# Patient Record
Sex: Female | Born: 1965 | Race: White | Hispanic: No | Marital: Married | Smoking: Never smoker
Health system: Southern US, Community
[De-identification: ages and names within clinical notes are randomized; demographics above are authoritative.]

## PROBLEM LIST (undated history)

## (undated) DIAGNOSIS — I10 Essential (primary) hypertension: Secondary | ICD-10-CM

## (undated) DIAGNOSIS — K219 Gastro-esophageal reflux disease without esophagitis: Secondary | ICD-10-CM

## (undated) DIAGNOSIS — F419 Anxiety disorder, unspecified: Secondary | ICD-10-CM

## (undated) DIAGNOSIS — M6282 Rhabdomyolysis: Secondary | ICD-10-CM

## (undated) DIAGNOSIS — Z87442 Personal history of urinary calculi: Secondary | ICD-10-CM

## (undated) DIAGNOSIS — N189 Chronic kidney disease, unspecified: Secondary | ICD-10-CM

## (undated) DIAGNOSIS — Z8489 Family history of other specified conditions: Secondary | ICD-10-CM

## (undated) DIAGNOSIS — C50919 Malignant neoplasm of unspecified site of unspecified female breast: Secondary | ICD-10-CM

## (undated) DIAGNOSIS — N390 Urinary tract infection, site not specified: Secondary | ICD-10-CM

## (undated) DIAGNOSIS — Z923 Personal history of irradiation: Secondary | ICD-10-CM

## (undated) HISTORY — DX: Malignant neoplasm of unspecified site of unspecified female breast: C50.919

## (undated) HISTORY — PX: OTHER SURGICAL HISTORY: SHX169

## (undated) HISTORY — PX: CHOLECYSTECTOMY: SHX55

## (undated) HISTORY — PX: MS MAMMO SCREENING: HXRAD480

## (undated) HISTORY — PX: COLONOSCOPY: SHX174

---

## 2004-08-30 ENCOUNTER — Ambulatory Visit: Payer: Self-pay | Admitting: Obstetrics and Gynecology

## 2004-09-07 ENCOUNTER — Ambulatory Visit: Payer: Self-pay | Admitting: Obstetrics and Gynecology

## 2005-10-23 ENCOUNTER — Ambulatory Visit: Payer: Self-pay | Admitting: Obstetrics and Gynecology

## 2006-10-25 ENCOUNTER — Ambulatory Visit: Payer: Self-pay | Admitting: Obstetrics and Gynecology

## 2007-07-14 ENCOUNTER — Ambulatory Visit: Payer: Self-pay | Admitting: Emergency Medicine

## 2007-10-10 DIAGNOSIS — N189 Chronic kidney disease, unspecified: Secondary | ICD-10-CM

## 2007-10-10 DIAGNOSIS — M6282 Rhabdomyolysis: Secondary | ICD-10-CM

## 2007-10-10 HISTORY — DX: Rhabdomyolysis: M62.82

## 2007-10-10 HISTORY — DX: Chronic kidney disease, unspecified: N18.9

## 2007-11-28 ENCOUNTER — Ambulatory Visit: Payer: Self-pay | Admitting: Obstetrics and Gynecology

## 2008-02-24 ENCOUNTER — Ambulatory Visit: Payer: Self-pay | Admitting: Family Medicine

## 2008-02-24 ENCOUNTER — Other Ambulatory Visit: Payer: Self-pay

## 2008-03-13 ENCOUNTER — Ambulatory Visit: Payer: Self-pay | Admitting: Gastroenterology

## 2008-04-21 ENCOUNTER — Ambulatory Visit: Payer: Self-pay | Admitting: Gastroenterology

## 2008-04-29 ENCOUNTER — Ambulatory Visit: Payer: Self-pay | Admitting: Gastroenterology

## 2008-12-10 ENCOUNTER — Ambulatory Visit: Payer: Self-pay | Admitting: Obstetrics and Gynecology

## 2008-12-22 ENCOUNTER — Inpatient Hospital Stay: Payer: Self-pay | Admitting: Internal Medicine

## 2008-12-22 ENCOUNTER — Ambulatory Visit: Payer: Self-pay | Admitting: Internal Medicine

## 2010-01-10 ENCOUNTER — Ambulatory Visit: Payer: Self-pay | Admitting: Obstetrics and Gynecology

## 2010-02-23 ENCOUNTER — Ambulatory Visit: Payer: Self-pay | Admitting: Family Medicine

## 2010-02-24 ENCOUNTER — Ambulatory Visit: Payer: Self-pay | Admitting: Surgery

## 2010-02-25 ENCOUNTER — Ambulatory Visit: Payer: Self-pay

## 2010-02-28 ENCOUNTER — Ambulatory Visit: Payer: Self-pay | Admitting: Surgery

## 2011-01-19 ENCOUNTER — Ambulatory Visit: Payer: Self-pay | Admitting: Obstetrics and Gynecology

## 2011-04-13 ENCOUNTER — Emergency Department: Payer: Self-pay | Admitting: Emergency Medicine

## 2011-05-02 ENCOUNTER — Ambulatory Visit: Payer: Self-pay | Admitting: Sports Medicine

## 2012-01-23 ENCOUNTER — Ambulatory Visit: Payer: Self-pay | Admitting: Obstetrics and Gynecology

## 2013-01-23 ENCOUNTER — Ambulatory Visit: Payer: Self-pay | Admitting: Obstetrics and Gynecology

## 2014-01-14 ENCOUNTER — Ambulatory Visit: Payer: Self-pay

## 2014-01-27 ENCOUNTER — Ambulatory Visit: Payer: Self-pay | Admitting: Obstetrics and Gynecology

## 2014-03-06 ENCOUNTER — Ambulatory Visit: Payer: Self-pay | Admitting: Gastroenterology

## 2014-08-12 DIAGNOSIS — F32A Depression, unspecified: Secondary | ICD-10-CM | POA: Insufficient documentation

## 2014-08-12 DIAGNOSIS — I10 Essential (primary) hypertension: Secondary | ICD-10-CM | POA: Insufficient documentation

## 2014-08-12 DIAGNOSIS — F419 Anxiety disorder, unspecified: Secondary | ICD-10-CM

## 2014-08-12 DIAGNOSIS — F329 Major depressive disorder, single episode, unspecified: Secondary | ICD-10-CM | POA: Insufficient documentation

## 2014-09-06 ENCOUNTER — Emergency Department: Payer: Self-pay | Admitting: Student

## 2015-03-31 ENCOUNTER — Ambulatory Visit (INDEPENDENT_AMBULATORY_CARE_PROVIDER_SITE_OTHER)
Admission: EM | Admit: 2015-03-31 | Discharge: 2015-03-31 | Disposition: A | Discharge status: Other Acute Inpatient Hospital | Payer: BLUE CROSS/BLUE SHIELD | Source: Home / Self Care | Attending: Family Medicine | Admitting: Family Medicine

## 2015-03-31 ENCOUNTER — Encounter: Payer: Self-pay | Admitting: Emergency Medicine

## 2015-03-31 ENCOUNTER — Emergency Department
Admission: EM | Admit: 2015-03-31 | Discharge: 2015-03-31 | Disposition: A | Discharge status: Home / Self Care | Payer: BLUE CROSS/BLUE SHIELD | Attending: Emergency Medicine | Admitting: Emergency Medicine

## 2015-03-31 DIAGNOSIS — Z79899 Other long term (current) drug therapy: Secondary | ICD-10-CM | POA: Insufficient documentation

## 2015-03-31 DIAGNOSIS — I10 Essential (primary) hypertension: Secondary | ICD-10-CM | POA: Diagnosis not present

## 2015-03-31 DIAGNOSIS — R350 Frequency of micturition: Secondary | ICD-10-CM | POA: Diagnosis present

## 2015-03-31 DIAGNOSIS — N10 Acute tubulo-interstitial nephritis: Secondary | ICD-10-CM | POA: Diagnosis not present

## 2015-03-31 DIAGNOSIS — N12 Tubulo-interstitial nephritis, not specified as acute or chronic: Secondary | ICD-10-CM

## 2015-03-31 HISTORY — DX: Urinary tract infection, site not specified: N39.0

## 2015-03-31 HISTORY — DX: Essential (primary) hypertension: I10

## 2015-03-31 HISTORY — DX: Rhabdomyolysis: M62.82

## 2015-03-31 LAB — CBC WITH DIFFERENTIAL/PLATELET
BASOS PCT: 0 %
Basophils Absolute: 0.1 10*3/uL (ref 0–0.1)
Eosinophils Absolute: 0 10*3/uL (ref 0–0.7)
Eosinophils Relative: 0 %
HEMATOCRIT: 38 % (ref 35.0–47.0)
HEMOGLOBIN: 12.7 g/dL (ref 12.0–16.0)
Lymphocytes Relative: 4 %
Lymphs Abs: 0.9 10*3/uL — ABNORMAL LOW (ref 1.0–3.6)
MCH: 29.2 pg (ref 26.0–34.0)
MCHC: 33.5 g/dL (ref 32.0–36.0)
MCV: 87.3 fL (ref 80.0–100.0)
MONO ABS: 1.5 10*3/uL — AB (ref 0.2–0.9)
Monocytes Relative: 7 %
Neutro Abs: 18 10*3/uL — ABNORMAL HIGH (ref 1.4–6.5)
Neutrophils Relative %: 89 %
Platelets: 203 10*3/uL (ref 150–440)
RBC: 4.36 MIL/uL (ref 3.80–5.20)
RDW: 12.9 % (ref 11.5–14.5)
WBC: 20.5 10*3/uL — ABNORMAL HIGH (ref 3.6–11.0)

## 2015-03-31 LAB — URINALYSIS COMPLETE WITH MICROSCOPIC (ARMC ONLY)
BACTERIA UA: NONE SEEN
BILIRUBIN URINE: NEGATIVE
Bilirubin Urine: NEGATIVE
Glucose, UA: 250 mg/dL — AB
KETONES UR: NEGATIVE mg/dL
Ketones, ur: NEGATIVE mg/dL
NITRITE: NEGATIVE
Nitrite: NEGATIVE
PH: 6 (ref 5.0–8.0)
Protein, ur: 100 mg/dL — AB
Protein, ur: 30 mg/dL — AB
Specific Gravity, Urine: 1.015 (ref 1.005–1.030)
Specific Gravity, Urine: 1.007 (ref 1.005–1.030)
pH: 6.5 (ref 5.0–8.0)

## 2015-03-31 LAB — POCT PREGNANCY, URINE: PREG TEST UR: NEGATIVE

## 2015-03-31 LAB — COMPREHENSIVE METABOLIC PANEL
ALT: 22 U/L (ref 14–54)
ANION GAP: 13 (ref 5–15)
AST: 29 U/L (ref 15–41)
Albumin: 4.1 g/dL (ref 3.5–5.0)
Alkaline Phosphatase: 93 U/L (ref 38–126)
BUN: 7 mg/dL (ref 6–20)
CO2: 22 mmol/L (ref 22–32)
CREATININE: 0.85 mg/dL (ref 0.44–1.00)
Calcium: 9.5 mg/dL (ref 8.9–10.3)
Chloride: 100 mmol/L — ABNORMAL LOW (ref 101–111)
GFR calc non Af Amer: >60 mL/min (ref >60)
Glucose, Bld: 244 mg/dL — ABNORMAL HIGH (ref 65–99)
POTASSIUM: 3 mmol/L — AB (ref 3.5–5.1)
SODIUM: 135 mmol/L (ref 135–145)
TOTAL PROTEIN: 8 g/dL (ref 6.5–8.1)
Total Bilirubin: 0.5 mg/dL (ref 0.3–1.2)

## 2015-03-31 MED ORDER — CEPHALEXIN 500 MG PO CAPS
ORAL_CAPSULE | ORAL | Status: AC
  Administered 2015-03-31: 500 mg via ORAL
  Filled 2015-03-31: qty 1

## 2015-03-31 MED ORDER — ONDANSETRON HCL 4 MG PO TABS: 4.0000 mg | ORAL_TABLET | Freq: Four times a day (QID) | ORAL | Status: DC | PRN

## 2015-03-31 MED ORDER — CIPROFLOXACIN HCL 500 MG PO TABS: 500.0000 mg | ORAL_TABLET | Freq: Two times a day (BID) | ORAL | Status: DC

## 2015-03-31 MED ORDER — OXYCODONE HCL 5 MG PO TABS: 5.0000 mg | ORAL_TABLET | Freq: Four times a day (QID) | ORAL | Status: DC | PRN

## 2015-03-31 MED ORDER — CIPROFLOXACIN IN D5W 400 MG/200ML IV SOLN
400.0000 mg | Freq: Once | INTRAVENOUS | Status: AC
  Administered 2015-03-31: 400 mg via INTRAVENOUS

## 2015-03-31 MED ORDER — ACETAMINOPHEN 500 MG PO TABS
1000.0000 mg | ORAL_TABLET | Freq: Once | ORAL | Status: AC
  Administered 2015-03-31: 1000 mg via ORAL

## 2015-03-31 MED ORDER — SODIUM CHLORIDE 0.9 % IV BOLUS (SEPSIS)
1000.0000 mL | Freq: Once | INTRAVENOUS | Status: AC
  Administered 2015-03-31: 1000 mL via INTRAVENOUS

## 2015-03-31 MED ORDER — SODIUM CHLORIDE 0.9 % IV SOLN
INTRAVENOUS | Status: DC
  Administered 2015-03-31: 16:00:00 via INTRAVENOUS

## 2015-03-31 MED ORDER — ACETAMINOPHEN 325 MG PO TABS: 650.0000 mg | ORAL_TABLET | Freq: Four times a day (QID) | ORAL | Status: DC | PRN

## 2015-03-31 MED ORDER — CEPHALEXIN 500 MG PO CAPS: 500.0000 mg | ORAL_CAPSULE | Freq: Three times a day (TID) | ORAL | Status: DC

## 2015-03-31 MED ORDER — CEPHALEXIN 500 MG PO CAPS
500.0000 mg | ORAL_CAPSULE | Freq: Once | ORAL | Status: AC
  Administered 2015-03-31: 500 mg via ORAL

## 2015-03-31 NOTE — Discharge Instructions (Signed)
Going to ER for further evaluation and treatment.

## 2015-03-31 NOTE — ED Notes (Signed)
Per Dr. Keith Rake, patient to be transferred to [facility] ER. EMS has arrived and report given to them.

## 2015-03-31 NOTE — ED Provider Notes (Addendum)
CSN: [id number]     Arrival date & time 03/31/15  1516 History   First MD Initiated Contact with Patient 03/31/15 1606     Chief Complaint  Patient presents with  . Cystitis   (Consider location/radiation/quality/duration/timing/severity/associated sxs/prior Treatment) HPI Patient presents today with history of dysuria urinary frequency since Sunday. Patient also has some right sided back pain. She started with a fever last night. And has vomited 1. She does have a history of urinary tract infections. She is mentating normally. She does have increased respiratory rate and is tachycardic in the office today. She denies any chest pain.  Past Medical History  Diagnosis Date  . Rhabdomyolysis     20 09  . Hypertension   . UTI (urinary tract infection)    Past Surgical History  Procedure Laterality Date  . Cholecystectomy     Family History  Problem Relation Age of Onset  . Cancer Mother    History  Substance Use Topics  . Smoking status: Never Smoker   . Smokeless tobacco: Not on file  . Alcohol Use: Yes     Comment: socially   OB History    No data available     Review of Systems negative except mentioned above  Allergies  Motrin and Phenergan  Home Medications   Prior to Admission medications   Medication Sig Start Date End Date Taking? Authorizing Provider  acetaminophen (TYLENOL) 500 MG tablet Take 1,000 mg by mouth every 6 (six) hours as needed.   Yes Historical Provider, MD  amLODipine (NORVASC) 10 MG tablet Take 10 mg by mouth daily.   Yes Historical Provider, MD  norethindrone-ethinyl estradiol (NECON,BREVICON,MODICON) 0.5-35 MG-MCG tablet Take 1 tablet by mouth daily.   Yes Historical Provider, MD  RABEprazole (ACIPHEX) 20 MG tablet Take 20 mg by mouth daily.   Yes Historical Provider, MD   BP 140/84 mmHg  Pulse 144  Temp(Src) 102.4 F (39.1 C) (Tympanic)  Resp 38  Ht 5\' 6"  (1.676 m)  Wt 170 lb (77.111 kg)  BMI 27.45 kg/m2  SpO2 99%  LMP 03/17/2015  (Approximate) Physical Exam GEN.-No apparent distress RESP-tachypnea, no wheezes or rhonci heard  CARDIAC-tachycardic ABD- +BS, mild suprapubic tenderness, mild right flank tenderness, no rebound or guarding  NEUROLOGIC-CN II-XII grossly intact, alert and awake  ED Course  Procedures (including critical care time) Labs Review Labs Reviewed  URINALYSIS COMPLETEWITH MICROSCOPIC ([place] ONLY) - Abnormal; Notable for the following:    APPearance HAZY (*)    Glucose, UA 250 (*)    Hgb urine dipstick TRACE (*)    Protein, ur 100 (*)    Leukocytes, UA 1+ (*)    Bacteria, UA FEW (*)    Squamous Epithelial / LPF 6-30 (*)    All other components within normal limits  URINE CULTURE  COMPREHENSIVE METABOLIC PANEL  CBC WITH DIFFERENTIAL/PLATELET    Imaging Review No results found.   MDM  Pyelonephritis-given patient's symptoms and vitals would recommend patient go to the ER for further evaluation and treatment. Asked nurse to place IV and and start fluids with Cipro IV, EMS to take patient to the ER now. Further blood work and/or imaging will be done at the ER. Tylenol 1 g PO was given to the patient I the nurse.    Paulina Fusi, MD 03/31/15 1617  Paulina Fusi, MD 03/31/15 [phone]

## 2015-03-31 NOTE — ED Notes (Signed)
AAOx3.  Skin warm and dry.  NAD.  Discharge home.  Patient ambulates with easy and steady gait.

## 2015-03-31 NOTE — ED Notes (Signed)
Patient placed up for discharge.  Per Dr. Joni Fears, patient to finish receiving liter of NS started PTA and the ordered 1L NS before discharge.  Discharge held until IVF infused.

## 2015-03-31 NOTE — ED Provider Notes (Signed)
[facility] Emergency Department Provider Note  ____________________________________________  Time seen: 5:30 PM  I have reviewed the triage vital signs and the nursing notes.   HISTORY  Chief Complaint Urinary Tract Infection    HPI Dana Harrell is a 49 y.o. female who reports urinary frequency and urgency 3 days. She drank lots of water to try and improve her symptoms that she does have history of UTI, but last night she was feeling very ill with nausea and severe fatigue. She went to urgent care today where she was found to have likely pyelonephritis and sent to the ED for further evaluation. The patient denies chest pain shortness of breath fevers chills headache syncope abdominal pain. No hematuria. She is tolerating oral intake.She does report some back pain     Past Medical History  Diagnosis Date  . Rhabdomyolysis     2009  . Hypertension   . UTI (urinary tract infection)     There are no active problems to display for this patient.   Past Surgical History  Procedure Laterality Date  . Cholecystectomy      Current Outpatient Rx  Name  Route  Sig  Dispense  Refill  . acetaminophen (TYLENOL) 325 MG tablet   Oral   Take 2 tablets (650 mg total) by mouth every 6 (six) hours as needed.   60 tablet   0   . acetaminophen (TYLENOL) 500 MG tablet   Oral   Take 1,000 mg by mouth every 6 (six) hours as needed.         Marland Kitchen amLODipine (NORVASC) 10 MG tablet   Oral   Take 10 mg by mouth daily.         . cephALEXin (KEFLEX) 500 MG capsule   Oral   Take 1 capsule (500 mg total) by mouth 3 (three) times daily.   21 capsule   0   . ciprofloxacin (CIPRO) 500 MG tablet   Oral   Take 1 tablet (500 mg total) by mouth 2 (two) times daily.   14 tablet   0   . norethindrone-ethinyl estradiol (NECON,BREVICON,MODICON) 0.5-35 MG-MCG tablet   Oral   Take 1 tablet by mouth daily.         . ondansetron (ZOFRAN) 4 MG tablet   Oral   Take 1  tablet (4 mg total) by mouth every 6 (six) hours as needed for nausea or vomiting.   20 tablet   1   . oxyCODONE (ROXICODONE) 5 MG immediate release tablet   Oral   Take 1 tablet (5 mg total) by mouth every 6 (six) hours as needed for breakthrough pain.   12 tablet   0   . RABEprazole (ACIPHEX) 20 MG tablet   Oral   Take 20 mg by mouth daily.           Allergies Motrin and Phenergan  Family History  Problem Relation Age of Onset  . Cancer Mother     Social History History  Substance Use Topics  . Smoking status: Never Smoker   . Smokeless tobacco: Not on file  . Alcohol Use: Yes     Comment: socially    Review of Systems  Constitutional: No fever or chills. No weight changes Eyes:No blurry vision or double vision.  ENT: No sore throat. Cardiovascular: No chest pain. Respiratory: No dyspnea or cough. Gastrointestinal: Negative for abdominal pain, vomiting and diarrhea.  No BRBPR or melena. Genitourinary: As above . Musculoskeletal: Positive for  back pain. No joint swelling or pain. Skin: Negative for rash. Neurological: Negative for headaches, focal weakness or numbness. Psychiatric:No anxiety or depression.   Endocrine:No hot/cold intolerance, changes in energy, or sleep difficulty.  10-point ROS otherwise negative.  ____________________________________________   PHYSICAL EXAM:  VITAL SIGNS: ED Triage Vitals  Enc Vitals Group     BP 03/31/15 1736 149/76 mmHg     Pulse Rate 03/31/15 1736 104     Resp 03/31/15 1736 16     Temp 03/31/15 1736 99.5 F (37.5 C)     Temp Source 03/31/15 1736 Oral     SpO2 03/31/15 1731 96 %     Weight 03/31/15 1736 170 lb (77.111 kg)     Height 03/31/15 1736 5\' 6"  (1.676 m)     Head Cir --      Peak Flow --      Pain Score 03/31/15 1737 2     Pain Loc --      Pain Edu? --      Excl. in [place]? --      Constitutional: Alert and oriented. Well appearing and in no distress. Eyes: No scleral icterus. No conjunctival  pallor. PERRL. EOMI ENT   Head: Normocephalic and atraumatic.   Nose: No congestion/rhinnorhea. No septal hematoma   Mouth/Throat: MMM, no pharyngeal erythema. No peritonsillar mass. No uvula shift.   Neck: No stridor. No SubQ emphysema. No meningismus. Hematological/Lymphatic/Immunilogical: No cervical lymphadenopathy. Cardiovascular: Tachycardic heart rate 105. Normal and symmetric distal pulses are present in all extremities. No murmurs, rubs, or gallops. Respiratory: Normal respiratory effort without tachypnea nor retractions. Breath sounds are clear and equal bilaterally. No wheezes/rales/rhonchi. Gastrointestinal: Suprapubic tenderness. No distention. There is bilateral CVA tenderness.  No rebound, rigidity, or guarding. Genitourinary: deferred Musculoskeletal: Nontender with normal range of motion in all extremities. No joint effusions.  No lower extremity tenderness.  No edema. Neurologic:   Normal speech and language.  CN 2-10 normal. Motor grossly intact. No pronator drift.  Normal gait. No gross focal neurologic deficits are appreciated.  Skin:  Skin is warm, dry and intact. No rash noted.  No petechiae, purpura, or bullae. Psychiatric: Mood and affect are normal. Speech and behavior are normal. Patient exhibits appropriate insight and judgment.  ____________________________________________    LABS (pertinent positives/negatives) (all labs ordered are listed, but only abnormal results are displayed) Labs Reviewed  URINALYSIS COMPLETEWITH MICROSCOPIC ([place]) - Abnormal; Notable for the following:    Color, Urine YELLOW (*)    APPearance CLEAR (*)    Glucose, UA >500 (*)    Hgb urine dipstick 1+ (*)    Protein, ur 30 (*)    Leukocytes, UA 1+ (*)    Squamous Epithelial / LPF 0-5 (*)    All other components within normal limits  URINE CULTURE  POC URINE PREG, ED  POCT PREGNANCY, URINE   urine white blood cell too numerous to count White blood cell count  20 with normal creatinine ____________________________________________   EKG    ____________________________________________    RADIOLOGY    ____________________________________________   PROCEDURES  ____________________________________________   INITIAL IMPRESSION / ASSESSMENT AND PLAN / ED COURSE  Pertinent labs & imaging results that were available during my care of the patient were reviewed by me and considered in my medical decision making (see chart for details).  Patient presents with pyelonephritis. She is febrile and tachycardic. However, fever has resolved with Tylenol, and heart rate has improved from 140-101 L IV fluids. The patient  feels much better and is relatively young and healthy and I think suitable for outpatient management. The patient agrees with this and preferred to go home. She is tolerating oral intake and has no severe symptoms at this time. I sent a urine culture to firm efficacy of intermittent microbial management. She was given IV Cipro at urgent care prior to transfer, salt continue her on Cipro but also add Keflex to ensure that her infection is treated due to possible resistance to Cipro. Patient is responsive to fluids and should be able to keep her self hydrated. I'll give her pain medicine and nausea medicine in addition to antibiotics and have her follow up with primary care in 1 week. Patient is not having any symptoms or findings to suggest that she has a complicating kidney stone in the setting of this infection.  ____________________________________________   FINAL CLINICAL IMPRESSION(S) / ED DIAGNOSES  Final diagnoses:  Acute pyelonephritis      Carrie Mew, MD 03/31/15 [phone]

## 2015-03-31 NOTE — ED Notes (Signed)
Pain with voiding and urinary frequency started Sunday. C/o low back pain. Fever started during this last night. Started with nausea this morning and vomited x 1 around 12:30hrs.

## 2015-03-31 NOTE — Discharge Instructions (Signed)
Pyelonephritis, Adult Pyelonephritis is a kidney infection. In general, there are 2 main types of pyelonephritis:  Infections that come on quickly without any warning (acute pyelonephritis).  Infections that persist for a long period of time (chronic pyelonephritis). CAUSES  Two main causes of pyelonephritis are:  Bacteria traveling from the bladder to the kidney. This is a problem especially in pregnant women. The urine in the bladder can become filled with bacteria from multiple causes, including:  Inflammation of the prostate gland (prostatitis).  Sexual intercourse in females.  Bladder infection (cystitis).  Bacteria traveling from the bloodstream to the tissue part of the kidney. Problems that may increase your risk of getting a kidney infection include:  Diabetes.  Kidney stones or bladder stones.  Cancer.  Catheters placed in the bladder.  Other abnormalities of the kidney or ureter. SYMPTOMS   Abdominal pain.  Pain in the side or flank area.  Fever.  Chills.  Upset stomach.  Blood in the urine (dark urine).  Frequent urination.  Strong or persistent urge to urinate.  Burning or stinging when urinating. DIAGNOSIS  Your caregiver may diagnose your kidney infection based on your symptoms. A urine sample may also be taken. TREATMENT  In general, treatment depends on how severe the infection is.   If the infection is mild and caught early, your caregiver may treat you with oral antibiotics and send you home.  If the infection is more severe, the bacteria may have gotten into the bloodstream. This will require intravenous (IV) antibiotics and a hospital stay. Symptoms may include:  High fever.  Severe flank pain.  Shaking chills.  Even after a hospital stay, your caregiver may require you to be on oral antibiotics for a period of time.  Other treatments may be required depending upon the cause of the infection. HOME CARE INSTRUCTIONS   Take your  antibiotics as directed. Finish them even if you start to feel better.  Make an appointment to have your urine checked to make sure the infection is gone.  Drink enough fluids to keep your urine clear or pale yellow.  Take medicines for the bladder if you have urgency and frequency of urination as directed by your caregiver. SEEK IMMEDIATE MEDICAL CARE IF:   You have a fever or persistent symptoms for more than 2-3 days.  You have a fever and your symptoms suddenly get worse.  You are unable to take your antibiotics or fluids.  You develop shaking chills.  You experience extreme weakness or fainting.  There is no improvement after 2 days of treatment. MAKE SURE YOU:  Understand these instructions.  Will watch your condition.  Will get help right away if you are not doing well or get worse. Document Released: 09/25/2005 Document Revised: 03/26/2012 Document Reviewed: 03/01/2011 [facility] Patient Information 2015 [place], [place]. This information is not intended to replace advice given to you by your health care provider. Make sure you discuss any questions you have with your health care provider.   Take Cipro and Keflex as prescribed to ensure that your kidney infection is adequately treated. Take oxycodone as needed for pain and continue taking Tylenol to control fever.

## 2015-03-31 NOTE — ED Notes (Signed)
Patient seen at urgent care today for UTI symptoms.  States began having urinary frequency and urgency Sunday.  Started drinking water, which usually improves symptoms.  Last night began feeling "very sick".  1L NS started at Urgent Care and Cipro 400 mg IV.  1 gm tylenol given PTA for temp of 102.

## 2015-04-01 ENCOUNTER — Encounter: Payer: Self-pay | Admitting: *Deleted

## 2015-04-01 DIAGNOSIS — I10 Essential (primary) hypertension: Secondary | ICD-10-CM | POA: Diagnosis not present

## 2015-04-01 DIAGNOSIS — N390 Urinary tract infection, site not specified: Secondary | ICD-10-CM | POA: Diagnosis not present

## 2015-04-01 LAB — URINE CULTURE
Culture: NO GROWTH
Special Requests: NORMAL

## 2015-04-01 NOTE — ED Notes (Signed)
Pt was seen here yesterday for UTI and she is not any better

## 2015-04-01 NOTE — ED Notes (Signed)
Patient seen here yesterday for Urinary tract infection and "isn't any better." PCP unable to see patient today.

## 2015-04-02 ENCOUNTER — Emergency Department
Admission: EM | Admit: 2015-04-02 | Discharge: 2015-04-02 | Discharge status: Against Medical Advice | Payer: BLUE CROSS/BLUE SHIELD | Attending: Emergency Medicine | Admitting: Emergency Medicine

## 2015-04-02 LAB — URINE CULTURE

## 2016-10-09 DIAGNOSIS — C50919 Malignant neoplasm of unspecified site of unspecified female breast: Secondary | ICD-10-CM

## 2016-10-09 DIAGNOSIS — Z923 Personal history of irradiation: Secondary | ICD-10-CM

## 2016-10-09 HISTORY — PX: BREAST LUMPECTOMY: SHX2

## 2016-10-09 HISTORY — PX: BREAST BIOPSY: SHX20

## 2016-10-09 HISTORY — DX: Malignant neoplasm of unspecified site of unspecified female breast: C50.919

## 2016-10-09 HISTORY — DX: Personal history of irradiation: Z92.3

## 2017-01-25 ENCOUNTER — Other Ambulatory Visit: Payer: Self-pay | Admitting: Obstetrics and Gynecology

## 2017-01-25 DIAGNOSIS — N6320 Unspecified lump in the left breast, unspecified quadrant: Secondary | ICD-10-CM

## 2017-02-02 ENCOUNTER — Ambulatory Visit
Admission: RE | Admit: 2017-02-02 | Discharge: 2017-02-02 | Disposition: A | Discharge status: Home / Self Care | Payer: BLUE CROSS/BLUE SHIELD | Source: Ambulatory Visit | Attending: Obstetrics and Gynecology | Admitting: Obstetrics and Gynecology

## 2017-02-02 ENCOUNTER — Encounter: Payer: Self-pay | Admitting: Radiology

## 2017-02-02 DIAGNOSIS — N6320 Unspecified lump in the left breast, unspecified quadrant: Secondary | ICD-10-CM

## 2017-02-05 ENCOUNTER — Other Ambulatory Visit: Payer: Self-pay | Admitting: Obstetrics and Gynecology

## 2017-02-05 DIAGNOSIS — R928 Other abnormal and inconclusive findings on diagnostic imaging of breast: Secondary | ICD-10-CM

## 2017-02-05 DIAGNOSIS — N6320 Unspecified lump in the left breast, unspecified quadrant: Secondary | ICD-10-CM

## 2017-02-06 ENCOUNTER — Ambulatory Visit
Admission: RE | Admit: 2017-02-06 | Discharge: 2017-02-06 | Disposition: A | Discharge status: Home / Self Care | Payer: BLUE CROSS/BLUE SHIELD | Source: Ambulatory Visit | Attending: Obstetrics and Gynecology | Admitting: Obstetrics and Gynecology

## 2017-02-06 DIAGNOSIS — C50412 Malignant neoplasm of upper-outer quadrant of left female breast: Secondary | ICD-10-CM | POA: Diagnosis not present

## 2017-02-06 DIAGNOSIS — R928 Other abnormal and inconclusive findings on diagnostic imaging of breast: Secondary | ICD-10-CM

## 2017-02-06 DIAGNOSIS — N6320 Unspecified lump in the left breast, unspecified quadrant: Secondary | ICD-10-CM | POA: Diagnosis present

## 2017-02-08 LAB — SURGICAL PATHOLOGY

## 2017-02-15 ENCOUNTER — Other Ambulatory Visit (HOSPITAL_COMMUNITY): Payer: Self-pay | Admitting: Surgery

## 2017-02-15 DIAGNOSIS — C50812 Malignant neoplasm of overlapping sites of left female breast: Secondary | ICD-10-CM

## 2017-02-20 ENCOUNTER — Other Ambulatory Visit (HOSPITAL_COMMUNITY): Payer: Self-pay | Admitting: Surgery

## 2017-02-20 DIAGNOSIS — C50812 Malignant neoplasm of overlapping sites of left female breast: Secondary | ICD-10-CM

## 2017-02-26 DIAGNOSIS — C50912 Malignant neoplasm of unspecified site of left female breast: Secondary | ICD-10-CM | POA: Insufficient documentation

## 2017-02-27 ENCOUNTER — Encounter: Payer: Self-pay | Admitting: Oncology

## 2017-02-27 ENCOUNTER — Ambulatory Visit (HOSPITAL_COMMUNITY)
Admission: RE | Admit: 2017-02-27 | Discharge: 2017-02-27 | Disposition: A | Discharge status: Home / Self Care | Payer: BLUE CROSS/BLUE SHIELD | Source: Ambulatory Visit | Attending: Surgery | Admitting: Surgery

## 2017-02-27 ENCOUNTER — Inpatient Hospital Stay: Payer: BLUE CROSS/BLUE SHIELD | Attending: Oncology | Admitting: Oncology

## 2017-02-27 VITALS — BP 135/78 | HR 80 | Temp 97.9°F | Resp 16 | Ht 66.0 in | Wt 178.0 lb

## 2017-02-27 DIAGNOSIS — Z170 Estrogen receptor positive status [ER+]: Secondary | ICD-10-CM

## 2017-02-27 DIAGNOSIS — C50812 Malignant neoplasm of overlapping sites of left female breast: Secondary | ICD-10-CM | POA: Diagnosis present

## 2017-02-27 DIAGNOSIS — I10 Essential (primary) hypertension: Secondary | ICD-10-CM | POA: Diagnosis not present

## 2017-02-27 DIAGNOSIS — Z803 Family history of malignant neoplasm of breast: Secondary | ICD-10-CM | POA: Diagnosis not present

## 2017-02-27 DIAGNOSIS — Z8739 Personal history of other diseases of the musculoskeletal system and connective tissue: Secondary | ICD-10-CM | POA: Insufficient documentation

## 2017-02-27 DIAGNOSIS — Z79899 Other long term (current) drug therapy: Secondary | ICD-10-CM | POA: Diagnosis not present

## 2017-02-27 DIAGNOSIS — C50912 Malignant neoplasm of unspecified site of left female breast: Secondary | ICD-10-CM

## 2017-02-27 DIAGNOSIS — Z87440 Personal history of urinary (tract) infections: Secondary | ICD-10-CM | POA: Insufficient documentation

## 2017-02-27 DIAGNOSIS — C50412 Malignant neoplasm of upper-outer quadrant of left female breast: Secondary | ICD-10-CM | POA: Diagnosis not present

## 2017-02-27 DIAGNOSIS — Z9049 Acquired absence of other specified parts of digestive tract: Secondary | ICD-10-CM | POA: Diagnosis not present

## 2017-02-27 LAB — POCT I-STAT CREATININE: Creatinine, Ser: 0.9 mg/dL (ref 0.44–1.00)

## 2017-02-27 MED ORDER — GADOBENATE DIMEGLUMINE 529 MG/ML IV SOLN
20.0000 mL | Freq: Once | INTRAVENOUS | Status: AC | PRN
  Administered 2017-02-27: 17 mL via INTRAVENOUS

## 2017-02-27 NOTE — Progress Notes (Signed)
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 Tamala Julian  Date of visit: 02/27/17   History of presenting illness- 1. Patient is a 51 year old premenopausal female who noticed a palpable left breast mass in April 2018. Her prior mammogram was in 2015 which was normal. She has not had any personal history of breast cancer or prior breast biopsies. She does have a strong family history of breast cancer in her maternal aunts. 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 Recent mammogram on 02/02/2017 showed: There is a lobular mass within the upper-outer left breast underlying the palpable the marker. Mass measures 2.5 x 2.1 cm on mammography. No additional concerning masses, calcifications or nonsurgical distortion identified within either breast.  Mammographic images were processed with CAD.  On physical exam, I palpate a firm mass within the upper-outer left breast.  Targeted ultrasound is performed, showing a 2.0 x 1.8 x 1.6 cm irregular hypoechoic mass left breast 3 o'clock position 4 cm from nipple, corresponding with palpable abnormality. No left axillary adenopathy.  3. Patient had an ultrasound-guided core biopsy of the left breast mass showed: DIAGNOSIS:  A. BREAST, LEFT, 3 O'CLOCK 4 CM FROM NIPPLE; ULTRASOUND-GUIDED CORE  BIOPSY:  - INVASIVE MAMMARY CARCINOMA, NO SPECIAL TYPE.   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 Patient's last definite menstrual cycle was in March 2018. She has had some minor bleeding for a couple of days last month. She is G5 P5 L5. Did not use birth control and did not breast feed. Menarche at the age of 25. ECOG PS- 0  Pain scale- 0   Review of systems- Review of Systems  Constitutional: Negative for chills, fever, malaise/fatigue and weight loss.  HENT: Negative for congestion, ear discharge and nosebleeds.   Eyes: Negative for blurred vision.  Respiratory: Negative for cough, hemoptysis, sputum production, shortness of breath and wheezing.   Cardiovascular: Negative for chest pain, palpitations, orthopnea and claudication.  Gastrointestinal: Negative for abdominal pain, blood in stool, constipation, diarrhea, heartburn, melena, nausea and vomiting.  Genitourinary: Negative for dysuria, flank pain, frequency, hematuria and urgency.  Musculoskeletal: Negative for back pain, joint pain and myalgias.  Skin: Negative for rash.  Neurological: Negative for dizziness, tingling, focal weakness, seizures, weakness and headaches.  Endo/Heme/Allergies: Does not bruise/bleed easily.  Psychiatric/Behavioral: Negative for depression and suicidal ideas. The patient does not have insomnia.     Allergies  Allergen Reactions  . Lovastatin  Other (See Comments)    Chest pain  . Motrin [Ibuprofen] Other (See Comments)    Hx of renal failure  .  Phenergan [Promethazine Hcl] Other (See Comments)    Excessive sleep  . Promethazine Other (See Comments)    Excessive sleepiness.    There are no active problems to display for this patient.    Past Medical History:  Diagnosis Date  . Hypertension   . Rhabdomyolysis    2009  . UTI (urinary tract infection)      Past Surgical History:  Procedure Laterality Date  . CHOLECYSTECTOMY    . COLONOSCOPY     4/17  . MS MAMMO SCREENING     4/18  . OTHER SURGICAL HISTORY     pap 4/18    Social History   Social History  . Marital status: Married    Spouse name: N/A  . Number of children: N/A  . Years of education: N/A   Occupational History  . Not on file.   Social History Main Topics  . Smoking status: Never Smoker  . Smokeless tobacco: Never Used  . Alcohol use Yes     Comment: socially  . Drug use: No  . Sexual activity: Not on file   Other Topics Concern  . Not on file   Social History Narrative  . No narrative on file     Family History  Problem Relation Age of Onset  . Colon cancer Mother 43  . Breast cancer Maternal Aunt        3 materal aunts all 53's  . Breast cancer Maternal Aunt   . Breast cancer Maternal Aunt      Current Outpatient Prescriptions:  .  amLODipine (NORVASC) 10 MG tablet, Take 10 mg by mouth daily., Disp: , Rfl:  .  OVER THE COUNTER MEDICATION, Take 1,000 mg by mouth daily., Disp: , Rfl:  .  RABEprazole (ACIPHEX) 20 MG tablet, Take 20 mg by mouth daily., Disp: , Rfl:  .  sertraline (ZOLOFT) 50 MG tablet, Take 50 mg by mouth daily., Disp: , Rfl:  .  acetaminophen (TYLENOL) 325 MG tablet, Take 2 tablets (650 mg total) by mouth every 6 (six) hours as needed. (Patient not taking: Reported on 02/27/2017), Disp: 60 tablet, Rfl: 0 .  acetaminophen (TYLENOL) 500 MG tablet, Take 1,000 mg by mouth every 6 (six) hours as needed., Disp: , Rfl:  .  cephALEXin (KEFLEX) 500 MG capsule, Take 1 capsule (500 mg total) by mouth 3 (three) times  daily. (Patient not taking: Reported on 02/27/2017), Disp: 21 capsule, Rfl: 0 .  ciprofloxacin (CIPRO) 500 MG tablet, Take 1 tablet (500 mg total) by mouth 2 (two) times daily. (Patient not taking: Reported on 02/27/2017), Disp: 14 tablet, Rfl: 0 .  norethindrone-ethinyl estradiol (NECON,BREVICON,MODICON) 0.5-35 MG-MCG tablet, Take 1 tablet by mouth daily., Disp: , Rfl:  .  ondansetron (ZOFRAN) 4 MG tablet, Take 1 tablet (4 mg total) by mouth every 6 (six) hours as needed for nausea or vomiting. (Patient not taking: Reported on 02/27/2017), Disp: 20 tablet, Rfl: 1 .  oxyCODONE (ROXICODONE) 5 MG immediate release tablet, Take 1 tablet (5 mg total) by mouth every 6 (six) hours as needed for breakthrough pain. (Patient not taking: Reported on 02/27/2017), Disp: 12 tablet, Rfl: 0   Physical exam:  Vitals:   02/27/17 1408  BP: 135/78  Pulse: 80  Resp: 16  Temp: 97.9 F (36.6 C)  TempSrc: Tympanic  SpO2: 98%  Weight: 178  lb (80.7 kg)  Height: 5' 6"  (1.676 m)   Physical Exam  Constitutional: She is oriented to person, place, and time and well-developed, well-nourished, and in no distress.  HENT:  Head: Normocephalic and atraumatic.  Eyes: EOM are normal. Pupils are equal, round, and reactive to light.  Neck: Normal range of motion.  Cardiovascular: Normal rate, regular rhythm and normal heart sounds.   Pulmonary/Chest: Effort normal and breath sounds normal.  Abdominal: Soft. Bowel sounds are normal.  Neurological: She is alert and oriented to person, place, and time.  Skin: Skin is warm and dry.    palpable breast mass in the left upper outer quadrant about 2 cm in size. No other palpable masses in bilateral breasts and no palpable axillary adenopathy bilaterally     CMP Latest Ref Rng & Units 02/27/2017  Glucose 65 - 99 mg/dL -  BUN 6 - 20 mg/dL -  Creatinine 0.44 - 1.00 mg/dL 0.90  Sodium 135 - 145 mmol/L -  Potassium 3.5 - 5.1 mmol/L -  Chloride 101 - 111 mmol/L -  CO2 22 - 32  mmol/L -  Calcium 8.9 - 10.3 mg/dL -  Total Protein 6.5 - 8.1 g/dL -  Total Bilirubin 0.3 - 1.2 mg/dL -  Alkaline Phos 38 - 126 U/L -  AST 15 - 41 U/L -  ALT 14 - 54 U/L -   CBC Latest Ref Rng & Units 03/31/2015  WBC 3.6 - 11.0 K/uL 20.5(H)  Hemoglobin 12.0 - 16.0 g/dL 12.7  Hematocrit 35.0 - 47.0 % 38.0  Platelets 150 - 440 K/uL 203    No images are attached to the encounter.  US Breast Ltd Uni Left Inc Axilla  Result Date: 02/02/2017 CLINICAL DATA:  Patient presents for palpable abnormality within the upper-outer left breast. EXAM: DIGITAL DIAGNOSTIC BILATERAL MAMMOGRAM WITH CAD ULTRASOUND LEFT BREAST COMPARISON:  Previous exam(s). ACR Breast Density Category c: The breast tissue is heterogeneously dense, which may obscure small masses. FINDINGS: There is a lobular mass within the upper-outer left breast underlying the palpable the marker. Mass measures 2.5 x 2.1 cm on mammography. No additional concerning masses, calcifications or nonsurgical distortion identified within either breast. Mammographic images were processed with CAD. On physical exam, I palpate a firm mass within the upper-outer left breast. Targeted ultrasound is performed, showing a 2.0 x 1.8 x 1.6 cm irregular hypoechoic mass left breast 3 o'clock position 4 cm from nipple, corresponding with palpable abnormality. No left axillary adenopathy. IMPRESSION: Suspicious palpable left breast mass. RECOMMENDATION: Ultrasound-guided core needle biopsy left breast mass. If mass demonstrates malignant pathology, recommend bilateral breast MRI given density of the breast tissue and location of the mass. I have discussed the findings and recommendations with the patient. Results were also provided in writing at the conclusion of the visit. If applicable, a reminder letter will be sent to the patient regarding the next appointment. BI-RADS CATEGORY  4: Suspicious. Electronically Signed   By: Lovey Newcomer M.D.   On: 02/02/2017 11:51   Mm Diag  Breast Tomo Bilateral  Result Date: 02/02/2017 CLINICAL DATA:  Patient presents for palpable abnormality within the upper-outer left breast. EXAM: DIGITAL DIAGNOSTIC BILATERAL MAMMOGRAM WITH CAD ULTRASOUND LEFT BREAST COMPARISON:  Previous exam(s). ACR Breast Density Category c: The breast tissue is heterogeneously dense, which may obscure small masses. FINDINGS: There is a lobular mass within the upper-outer left breast underlying the palpable the marker. Mass measures 2.5 x 2.1 cm on mammography. No additional concerning masses, calcifications  or nonsurgical distortion identified within either breast. Mammographic images were processed with CAD. On physical exam, I palpate a firm mass within the upper-outer left breast. Targeted ultrasound is performed, showing a 2.0 x 1.8 x 1.6 cm irregular hypoechoic mass left breast 3 o'clock position 4 cm from nipple, corresponding with palpable abnormality. No left axillary adenopathy. IMPRESSION: Suspicious palpable left breast mass. RECOMMENDATION: Ultrasound-guided core needle biopsy left breast mass. If mass demonstrates malignant pathology, recommend bilateral breast MRI given density of the breast tissue and location of the mass. I have discussed the findings and recommendations with the patient. Results were also provided in writing at the conclusion of the visit. If applicable, a reminder letter will be sent to the patient regarding the next appointment. BI-RADS CATEGORY  4: Suspicious. Electronically Signed   By: Lovey Newcomer M.D.   On: 02/02/2017 11:51   Mm Clip Placement Left  Result Date: 02/06/2017 CLINICAL DATA:  Post biopsy mammogram of the left breast for clip placement. EXAM: DIAGNOSTIC LEFT MAMMOGRAM POST ULTRASOUND BIOPSY COMPARISON:  Previous exam(s). FINDINGS: Mammographic images were obtained following ultrasound guided biopsy of a left breast mass just slightly superior to 3 o'clock. The ribbon shaped biopsy marking clip is appropriately positioned  at the site of biopsied superior to the 3 o'clock axis. IMPRESSION: Appropriate positioning of the ribbon shaped biopsy marking clip just slightly superior to the 3 o'clock axis in the left breast. Final Assessment: Post Procedure Mammograms for Marker Placement Electronically Signed   By: Ammie Ferrier M.D.   On: 02/06/2017 08:58   [place] Lt Breast Bx W Loc Dev 1st Lesion Img Bx Spec US Guide  Addendum Date: 02/09/2017   ADDENDUM REPORT: 02/09/2017 10:25 ADDENDUM: A surgical referral was made with Dr. Tamala Julian on 02/15/17 at 2:45 PM by Jetta Lout, [facility]). The patient has been notified of the appointment. Addendum by Jetta Lout, RRA on 02/09/17. Electronically Signed   By: Curlene Dolphin M.D.   On: 02/09/2017 10:25   Addendum Date: 02/09/2017   ADDENDUM REPORT: 02/09/2017 09:47 ADDENDUM: Pathology results for the patient's left breast ultrasound-guided biopsy are invasive mammary carcinoma, no special type, grade 2. Low grade DCIS present. Pathology results are concordant with imaging findings. Pathology results were relayed by myself to the patient by telephone on 02/09/2017 at 9:40 a.m. Ms. Etzkorn questions were answered. She would like a surgical referral made to Dr. Tamala Julian in [place]. She is aware that she will be called regarding the surgical referral appointment. She reports doing well following the breast biopsy. Electronically Signed   By: Curlene Dolphin M.D.   On: 02/09/2017 09:47   Result Date: 02/09/2017 CLINICAL DATA:  51 year old female presenting for ultrasound-guided biopsy of a palpable left breast mass. EXAM: ULTRASOUND GUIDED LEFT BREAST CORE NEEDLE BIOPSY COMPARISON:  Previous exam(s). FINDINGS: I met with the patient and we discussed the procedure of ultrasound-guided biopsy, including benefits and alternatives. We discussed the high likelihood of a successful procedure. We discussed the risks of the procedure, including infection, bleeding, tissue injury, clip migration,  and inadequate sampling. Informed written consent was given. The usual time-out protocol was performed immediately prior to the procedure. Lesion quadrant: Upper-outer Using sterile technique and 1% Lidocaine as local anesthetic, under direct ultrasound visualization, a 14 gauge spring-loaded device was used to perform biopsy of a mass in the left breast just slightly superior to the 3 o'clock axis using a lateral approach. At the conclusion of the procedure a ribbon shaped tissue marker  clip was deployed into the biopsy cavity. Follow up 2 view mammogram was performed and dictated separately. IMPRESSION: Ultrasound guided biopsy of left breast mass just slightly superior to the 3 o'clock axis. No apparent complications. Electronically Signed: By: Ammie Ferrier M.D. On: 02/06/2017 08:49    Assessment and plan- Patient is a 51 y.o. female With newly diagnosed stage IB  c T2 N0 M0 invasive mammary carcinoma ER/PR positive and HER-2/neu negative on core biopsy    Currently we are awaiting the results of the MRI of the breast which was recommended due to dense breasts. Given that she has a relatively small size of tumor with negative axillary lymph nodes and a strongly estrogen positive tumor, I do not think that patient needs neoadjuvant chemotherapy at this time unless MRI of the breast as as a different story. I would recommend that she should proceed with definitive from surgery with lumpectomy and sentinel lymph node biopsy which is scheduled on 03/09/2017. I will meet her 1 week post surgery and based on her pathology discuss adjuvant treatment options at that time. I did briefly discuss the utility of mammogram testing in lymph node negative cancer. If she were to fall in the low risk group she would not benefit from chemotherapy and if she were to be in the high risk would benefit from chemotherapy. I will decide about mammogram testing based on her final pathology   Total face to face encounter time  for this patient visit was 45 min. >50% of the time was  spent in counseling and coordination of care.      Thank you for this kind referral and the opportunity to participate in the care of this patient   Visit Diagnosis 1. Malignant neoplasm of left breast in female, estrogen receptor positive, unspecified site of breast ([place])     Dr. Randa Evens, MD, MPH [place] [facility] Pager- [phone] 02/27/2017

## 2017-02-28 NOTE — Progress Notes (Signed)
  Oncology Nurse Navigator Documentation  Navigator Location: [facility]-Med Onc (02/27/17 1500) Referral date to RadOnc/MedOnc: 02/27/17 (02/27/17 1500) )Navigator Encounter Type: Initial MedOnc (02/27/17 1500)   Abnormal Finding Date: 02/02/17 (02/27/17 1500) Confirmed Diagnosis Date: 02/06/17 (02/27/17 1500) Surgery Date: 03/09/17 (02/27/17 1500)             Patient Visit Type: Follow-up;MedOnc (02/27/17 1500) Treatment Phase: Pre-Tx/Tx Discussion (02/27/17 1500) Barriers/Navigation Needs: Education;Coordination of Care (02/27/17 1500) Education: Newly Diagnosed Cancer Education;Understanding Cancer/ Treatment Options (02/27/17 1500) Interventions: Coordination of Care;Education (02/27/17 1500)   Coordination of Care: Appts (02/13/17 0950)                  Time Spent with Patient: 60 (02/27/17 1500)   Met with patient and husband at initial Med/Onc visit with Dr. Janese Banks.  Waitingt for MRI results to finalize treatment plan.  Contacetd Radiologist at [employer].  He will notify Dr. Janese Banks of MRI results.

## 2017-03-02 ENCOUNTER — Encounter
Admission: RE | Admit: 2017-03-02 | Discharge: 2017-03-02 | Disposition: A | Discharge status: Home / Self Care | Payer: BLUE CROSS/BLUE SHIELD | Source: Ambulatory Visit | Attending: Surgery | Admitting: Surgery

## 2017-03-02 DIAGNOSIS — Z01810 Encounter for preprocedural cardiovascular examination: Secondary | ICD-10-CM | POA: Diagnosis not present

## 2017-03-02 HISTORY — DX: Chronic kidney disease, unspecified: N18.9

## 2017-03-02 HISTORY — DX: Personal history of urinary calculi: Z87.442

## 2017-03-02 HISTORY — DX: Gastro-esophageal reflux disease without esophagitis: K21.9

## 2017-03-02 HISTORY — DX: Anxiety disorder, unspecified: F41.9

## 2017-03-02 NOTE — Patient Instructions (Signed)
  Your procedure is scheduled on: 03-09-17 FRIDAY Report to [place] (2ND DESK ON RIGHT) @ 7:45 AM   Remember: Instructions that are not followed completely may result in serious medical risk, up to and including death, or upon the discretion of your surgeon and anesthesiologist your surgery may need to be rescheduled.    _x___ 1. Do not eat food or drink liquids after midnight. No gum chewing or hard candies.     __x__ 2. No Alcohol for 24 hours before or after surgery.   __x__3. No Smoking for 24 prior to surgery.   ____  4. Bring all medications with you on the day of surgery if instructed.    __x__ 5. Notify your doctor if there is any change in your medical condition     (cold, fever, infections).     Do not wear jewelry, make-up, hairpins, clips or nail polish.  Do not wear lotions, powders, or perfumes. You may wear deodorant.  Do not shave 48 hours prior to surgery. Men may shave face and neck.  Do not bring valuables to the hospital.    [facility] is not responsible for any belongings or valuables.               Contacts, dentures or bridgework may not be worn into surgery.  Leave your suitcase in the car. After surgery it may be brought to your room.  For patients admitted to the hospital, discharge time is determined by your treatment team.   Patients discharged the day of surgery will not be allowed to drive home.  You will need someone to drive you home and stay with you the night of your procedure.    Please read over the following fact sheets that you were given:     _x___ [place] WITH A SMALL SIP OF WATER. These include:  1. NORVASC (AMLODIPINE)  2. ZOLOFT (SERTRALINE)  3. ACIPHEX  4. TAKE AN EXTRA ACIPHEX Thursday NIGHT BEFORE BED  5.  6.  ____Fleets enema or Magnesium Citrate as directed.   _x___ Use CHG Soap or sage wipes as directed on instruction sheet   ____ Use inhalers on the day of  surgery and bring to hospital day of surgery  ____ Stop Metformin and Janumet 2 days prior to surgery.    ____ Take 1/2 of usual insulin dose the night before surgery and none on the morning surgery.   ____ Follow recommendations from Cardiologist, Pulmonologist or PCP regarding stopping Aspirin, Coumadin, Pllavix ,Eliquis, Effient, or Pradaxa, and Pletal.  X____Stop Anti-inflammatories such as Advil, Aleve, Ibuprofen, Motrin, Naproxen, Naprosyn, Goodies powders or aspirin products NOW-OK to take Tylenol    _x___ Stop supplements until after surgery-STOP FISH OIL NOW-MAY RESUME AFTER SURGERY   ____ Bring C-Pap to the hospital.

## 2017-03-09 ENCOUNTER — Encounter: Payer: Self-pay | Admitting: Oncology

## 2017-03-09 ENCOUNTER — Encounter
Admission: RE | Disposition: A | Discharge status: Home / Self Care | Payer: Self-pay | Source: Ambulatory Visit | Attending: Surgery

## 2017-03-09 ENCOUNTER — Ambulatory Visit
Admission: RE | Admit: 2017-03-09 | Discharge: 2017-03-09 | Disposition: A | Discharge status: Home / Self Care | Payer: BLUE CROSS/BLUE SHIELD | Source: Ambulatory Visit | Attending: Surgery | Admitting: Surgery

## 2017-03-09 ENCOUNTER — Ambulatory Visit: Payer: BLUE CROSS/BLUE SHIELD | Admitting: Certified Registered Nurse Anesthetist

## 2017-03-09 ENCOUNTER — Encounter: Payer: Self-pay | Admitting: *Deleted

## 2017-03-09 ENCOUNTER — Other Ambulatory Visit: Payer: BLUE CROSS/BLUE SHIELD

## 2017-03-09 ENCOUNTER — Encounter: Payer: Self-pay | Admitting: Anesthesiology

## 2017-03-09 ENCOUNTER — Encounter
Admission: RE | Admit: 2017-03-09 | Discharge: 2017-03-09 | Disposition: A | Discharge status: Home / Self Care | Payer: BLUE CROSS/BLUE SHIELD | Source: Ambulatory Visit | Attending: Surgery | Admitting: Surgery

## 2017-03-09 DIAGNOSIS — F419 Anxiety disorder, unspecified: Secondary | ICD-10-CM | POA: Diagnosis not present

## 2017-03-09 DIAGNOSIS — I10 Essential (primary) hypertension: Secondary | ICD-10-CM | POA: Diagnosis not present

## 2017-03-09 DIAGNOSIS — N62 Hypertrophy of breast: Secondary | ICD-10-CM | POA: Diagnosis not present

## 2017-03-09 DIAGNOSIS — M6282 Rhabdomyolysis: Secondary | ICD-10-CM | POA: Diagnosis not present

## 2017-03-09 DIAGNOSIS — C50812 Malignant neoplasm of overlapping sites of left female breast: Secondary | ICD-10-CM

## 2017-03-09 DIAGNOSIS — K219 Gastro-esophageal reflux disease without esophagitis: Secondary | ICD-10-CM | POA: Insufficient documentation

## 2017-03-09 HISTORY — PX: MASTECTOMY, PARTIAL: SHX709

## 2017-03-09 HISTORY — PX: SENTINEL NODE BIOPSY: SHX6608

## 2017-03-09 LAB — POCT PREGNANCY, URINE: Preg Test, Ur: NEGATIVE

## 2017-03-09 SURGERY — MASTECTOMY PARTIAL
Anesthesia: General | Site: Breast | Laterality: Left | Wound class: Clean

## 2017-03-09 MED ORDER — FENTANYL CITRATE (PF) 100 MCG/2ML IJ SOLN
INTRAMUSCULAR | Status: AC
  Filled 2017-03-09: qty 2

## 2017-03-09 MED ORDER — LIDOCAINE HCL (PF) 2 % IJ SOLN
INTRAMUSCULAR | Status: AC
  Filled 2017-03-09: qty 2

## 2017-03-09 MED ORDER — ACETAMINOPHEN 10 MG/ML IV SOLN
INTRAVENOUS | Status: DC | PRN
  Administered 2017-03-09: 1000 mg via INTRAVENOUS

## 2017-03-09 MED ORDER — METOPROLOL TARTRATE 5 MG/5ML IV SOLN
3.0000 mg | Freq: Once | INTRAVENOUS | Status: AC
  Administered 2017-03-09: 3 mg via INTRAVENOUS

## 2017-03-09 MED ORDER — MIDAZOLAM HCL 2 MG/2ML IJ SOLN
INTRAMUSCULAR | Status: DC | PRN
  Administered 2017-03-09: 2 mg via INTRAVENOUS

## 2017-03-09 MED ORDER — PHENYLEPHRINE HCL 10 MG/ML IJ SOLN
INTRAMUSCULAR | Status: AC
  Filled 2017-03-09: qty 1

## 2017-03-09 MED ORDER — EPHEDRINE SULFATE 50 MG/ML IJ SOLN
INTRAMUSCULAR | Status: AC
  Filled 2017-03-09: qty 1

## 2017-03-09 MED ORDER — FENTANYL CITRATE (PF) 100 MCG/2ML IJ SOLN
INTRAMUSCULAR | Status: DC | PRN
  Administered 2017-03-09: 50 ug via INTRAVENOUS
  Administered 2017-03-09 (×2): 25 ug via INTRAVENOUS
  Administered 2017-03-09 (×2): 50 ug via INTRAVENOUS

## 2017-03-09 MED ORDER — PROPOFOL 10 MG/ML IV BOLUS
INTRAVENOUS | Status: AC
  Filled 2017-03-09: qty 20

## 2017-03-09 MED ORDER — LIDOCAINE HCL (CARDIAC) 20 MG/ML IV SOLN
INTRAVENOUS | Status: DC | PRN
  Administered 2017-03-09: 80 mg via INTRAVENOUS

## 2017-03-09 MED ORDER — SEVOFLURANE IN SOLN
RESPIRATORY_TRACT | Status: AC
  Filled 2017-03-09: qty 250

## 2017-03-09 MED ORDER — METOPROLOL TARTRATE 5 MG/5ML IV SOLN
INTRAVENOUS | Status: AC
  Filled 2017-03-09: qty 5

## 2017-03-09 MED ORDER — BUPIVACAINE-EPINEPHRINE (PF) 0.5% -1:200000 IJ SOLN
INTRAMUSCULAR | Status: DC | PRN
  Administered 2017-03-09: 17 mL via PERINEURAL

## 2017-03-09 MED ORDER — ONDANSETRON HCL 4 MG/2ML IJ SOLN
INTRAMUSCULAR | Status: DC | PRN
  Administered 2017-03-09: 4 mg via INTRAVENOUS

## 2017-03-09 MED ORDER — ONDANSETRON HCL 4 MG/2ML IJ SOLN
INTRAMUSCULAR | Status: AC
  Filled 2017-03-09: qty 2

## 2017-03-09 MED ORDER — FENTANYL CITRATE (PF) 100 MCG/2ML IJ SOLN: 25.0000 ug | INTRAMUSCULAR | Status: DC | PRN

## 2017-03-09 MED ORDER — PROPOFOL 10 MG/ML IV BOLUS
INTRAVENOUS | Status: DC | PRN
Start: 2017-03-09 — End: 2017-03-09
  Administered 2017-03-09: 200 mg via INTRAVENOUS
  Administered 2017-03-09: 50 mg via INTRAVENOUS

## 2017-03-09 MED ORDER — EPHEDRINE SULFATE 50 MG/ML IJ SOLN
INTRAMUSCULAR | Status: DC | PRN
  Administered 2017-03-09: 7.5 mg via INTRAVENOUS
  Administered 2017-03-09: 5 mg via INTRAVENOUS
  Administered 2017-03-09: 7.5 mg via INTRAVENOUS

## 2017-03-09 MED ORDER — HYDROCODONE-ACETAMINOPHEN 5-325 MG PO TABS: 1.0000 | ORAL_TABLET | ORAL | Status: DC | PRN

## 2017-03-09 MED ORDER — DEXAMETHASONE SODIUM PHOSPHATE 10 MG/ML IJ SOLN
INTRAMUSCULAR | Status: AC
  Filled 2017-03-09: qty 1

## 2017-03-09 MED ORDER — OXYCODONE HCL 5 MG PO TABS
ORAL_TABLET | ORAL | Status: AC
  Filled 2017-03-09: qty 1

## 2017-03-09 MED ORDER — DEXAMETHASONE SODIUM PHOSPHATE 10 MG/ML IJ SOLN
INTRAMUSCULAR | Status: DC | PRN
  Administered 2017-03-09: 8 mg via INTRAVENOUS

## 2017-03-09 MED ORDER — BUPIVACAINE-EPINEPHRINE (PF) 0.5% -1:200000 IJ SOLN
INTRAMUSCULAR | Status: AC
  Filled 2017-03-09: qty 30

## 2017-03-09 MED ORDER — OXYCODONE HCL 5 MG/5ML PO SOLN: 5.0000 mg | Freq: Once | ORAL | Status: AC | PRN

## 2017-03-09 MED ORDER — OXYCODONE HCL 5 MG PO TABS
5.0000 mg | ORAL_TABLET | Freq: Once | ORAL | Status: AC | PRN
  Administered 2017-03-09: 5 mg via ORAL

## 2017-03-09 MED ORDER — MIDAZOLAM HCL 2 MG/2ML IJ SOLN
INTRAMUSCULAR | Status: AC
Start: 2017-03-09 — End: 2017-03-09
  Filled 2017-03-09: qty 2

## 2017-03-09 MED ORDER — LACTATED RINGERS IV SOLN
INTRAVENOUS | Status: DC
  Administered 2017-03-09 (×2): via INTRAVENOUS

## 2017-03-09 MED ORDER — TECHNETIUM TC 99M SULFUR COLLOID FILTERED
746.0000 | Freq: Once | INTRAVENOUS | Status: AC | PRN
  Administered 2017-03-09: 746 via INTRADERMAL

## 2017-03-09 MED ORDER — ACETAMINOPHEN 10 MG/ML IV SOLN
INTRAVENOUS | Status: AC
  Filled 2017-03-09: qty 100

## 2017-03-09 MED ORDER — PHENYLEPHRINE HCL 10 MG/ML IJ SOLN
INTRAMUSCULAR | Status: DC | PRN
  Administered 2017-03-09 (×6): 100 ug via INTRAVENOUS

## 2017-03-09 MED ORDER — HYDROCODONE-ACETAMINOPHEN 5-325 MG PO TABS: 1.0000 | ORAL_TABLET | ORAL | 0 refills | Status: DC | PRN

## 2017-03-09 SURGICAL SUPPLY — 37 items
BLADE SURG 15 STRL LF DISP TIS (BLADE) ×2 IMPLANT
BLADE SURG 15 STRL SS (BLADE) ×2
CANISTER SUCT 1200ML W/VALVE (MISCELLANEOUS) ×4 IMPLANT
CHLORAPREP W/TINT 26ML (MISCELLANEOUS) ×4 IMPLANT
CNTNR SPEC 2.5X3XGRAD LEK (MISCELLANEOUS) ×4
CONT SPEC 4OZ STER OR WHT (MISCELLANEOUS) ×4
CONTAINER SPEC 2.5X3XGRAD LEK (MISCELLANEOUS) ×4 IMPLANT
COVER LIGHT HANDLE STERIS (MISCELLANEOUS) ×4 IMPLANT
DERMABOND ADVANCED (GAUZE/BANDAGES/DRESSINGS) ×4
DERMABOND ADVANCED .7 DNX12 (GAUZE/BANDAGES/DRESSINGS) ×4 IMPLANT
DRAPE INCISE 23X17 IOBAN STRL (DRAPES) ×2
DRAPE INCISE IOBAN 23X17 STRL (DRAPES) ×2 IMPLANT
DRAPE LAPAROTOMY 77X122 PED (DRAPES) ×4 IMPLANT
DRAPE SHEET LG 3/4 BI-LAMINATE (DRAPES) ×4 IMPLANT
ELECT REM PT RETURN 9FT ADLT (ELECTROSURGICAL) ×4
ELECTRODE REM PT RTRN 9FT ADLT (ELECTROSURGICAL) ×2 IMPLANT
GLOVE BIO SURGEON STRL SZ7.5 (GLOVE) ×4 IMPLANT
GOWN STRL REUS W/ TWL LRG LVL3 (GOWN DISPOSABLE) ×4 IMPLANT
GOWN STRL REUS W/TWL LRG LVL3 (GOWN DISPOSABLE) ×4
KIT RM TURNOVER STRD PROC AR (KITS) ×4 IMPLANT
LABEL OR SOLS (LABEL) ×4 IMPLANT
MARGIN MAP 10MM (MISCELLANEOUS) ×4 IMPLANT
NDL SAFETY 18GX1.5 (NEEDLE) IMPLANT
NDL SAFETY 22GX1.5 (NEEDLE) ×4 IMPLANT
NDL SAFETY 25GX1.5 (NEEDLE) ×4 IMPLANT
NEEDLE HYPO 25X1 1.5 SAFETY (NEEDLE) ×4 IMPLANT
PACK BASIN MINOR ARMC (MISCELLANEOUS) ×4 IMPLANT
SLEVE PROBE SENORX GAMMA FIND (MISCELLANEOUS) ×4 IMPLANT
SUT CHROMIC 3 0 SH 27 (SUTURE) ×4 IMPLANT
SUT CHROMIC 4 0 RB 1X27 (SUTURE) ×4 IMPLANT
SUT ETHILON 3-0 FS-10 30 BLK (SUTURE) ×4
SUT MNCRL 4-0 (SUTURE) ×2
SUT MNCRL 4-0 27XMFL (SUTURE) ×2
SUTURE EHLN 3-0 FS-10 30 BLK (SUTURE) ×2 IMPLANT
SUTURE MNCRL 4-0 27XMF (SUTURE) ×2 IMPLANT
SYRINGE 10CC LL (SYRINGE) ×4 IMPLANT
WATER STERILE IRR 1000ML POUR (IV SOLUTION) ×4 IMPLANT

## 2017-03-09 NOTE — Anesthesia Preprocedure Evaluation (Signed)
Anesthesia Evaluation  Patient identified by MRN, date of birth, ID band Patient awake    Reviewed: Allergy & Precautions, H&P , NPO status , Patient's Chart, lab work & pertinent test results  History of Anesthesia Complications Negative for: history of anesthetic complications  Airway Mallampati: III  TM Distance: <3 FB Neck ROM: limited    Dental  (+) Poor Dentition, Chipped, Caps   Pulmonary neg pulmonary ROS, neg shortness of breath,    Pulmonary exam normal breath sounds clear to auscultation       Cardiovascular Exercise Tolerance: Good hypertension, (-) angina(-) Past MI and (-) DOE Normal cardiovascular exam Rhythm:regular Rate:Normal     Neuro/Psych PSYCHIATRIC DISORDERS  Neuromuscular disease    GI/Hepatic Neg liver ROS, GERD  Controlled and Medicated,  Endo/Other  negative endocrine ROS  Renal/GU CRFRenal disease     Musculoskeletal   Abdominal   Peds  Hematology negative hematology ROS (+)   Anesthesia Other Findings Past Medical History: No date: Anxiety No date: Cancer [facility]) 2009: Chronic kidney disease     Comment: H/O ACUTE KIDNEY FAILURE WITH RHABDOMYOLOSIS No date: GERD (gastroesophageal reflux disease) No date: History of kidney stones No date: Hypertension No date: Rhabdomyolysis     Comment: 2009 No date: UTI (urinary tract infection)  Past Surgical History: No date: CHOLECYSTECTOMY No date: COLONOSCOPY     Comment: 4/17 No date: MS MAMMO SCREENING     Comment: 4/18 No date: OTHER SURGICAL HISTORY     Comment: pap 4/18  BMI    Body Mass Index:  28.73 kg/m      Reproductive/Obstetrics negative OB ROS                             Anesthesia Physical Anesthesia Plan  ASA: III  Anesthesia Plan: General LMA   Post-op Pain Management:    Induction: Intravenous  Airway Management Planned: LMA  Additional Equipment:   Intra-op Plan:    Post-operative Plan: Extubation in OR  Informed Consent: I have reviewed the patients History and Physical, chart, labs and discussed the procedure including the risks, benefits and alternatives for the proposed anesthesia with the patient or authorized representative who has indicated his/her understanding and acceptance.   Dental Advisory Given  Plan Discussed with: Anesthesiologist, CRNA and Surgeon  Anesthesia Plan Comments: (Patient consented for risks of anesthesia including but not limited to:  - adverse reactions to medications - damage to teeth, lips or other oral mucosa - sore throat or hoarseness - Damage to heart, brain, lungs or loss of life  Patient voiced understanding.)        Anesthesia Quick Evaluation

## 2017-03-09 NOTE — Anesthesia Procedure Notes (Signed)
Procedure Name: LMA Insertion Date/Time: 03/09/2017 10:23 AM Performed by: Tamala Julian, Danyael Alipio Pre-anesthesia Checklist: Patient identified, Emergency Drugs available, Patient being monitored, Timeout performed and Suction available Patient Re-evaluated:Patient Re-evaluated prior to inductionOxygen Delivery Method: Circle system utilized Preoxygenation: Pre-oxygenation with 100% oxygen Intubation Type: IV induction Ventilation: Mask ventilation without difficulty LMA: LMA inserted LMA Size: 3.5 Number of attempts: 1 Placement Confirmation: positive ETCO2 and breath sounds checked- equal and bilateral Tube secured with: Tape Dental Injury: Teeth and Oropharynx as per pre-operative assessment

## 2017-03-09 NOTE — H&P (Signed)
  She comes in today prepared for left partial mastectomy with axillary sentinel lymph node biopsy. She had recently presented with a palpable mass of the upper outer quadrant. Biopsy demonstrated invasive mammary carcinoma. MRI demonstrated no other suspicious areas in either breast. Surgery was recommended for definitive treatment.  She reports no change in condition since the office visit.  The left side was marked YES graft I discussed the plan for surgery

## 2017-03-09 NOTE — Anesthesia Post-op Follow-up Note (Cosign Needed)
Anesthesia QCDR form completed.        

## 2017-03-09 NOTE — Op Note (Signed)
OPERATIVE REPORT  PREOPERATIVE  DIAGNOSIS: . Left breast cancer  POSTOPERATIVE DIAGNOSIS: . Left breast cancer  PROCEDURE: . Left partial mastectomy with axillary sentinel lymph node biopsy  ANESTHESIA:  General  SURGEON: Rochel Brome  MD   INDICATIONS: . She had recent findings of a palpable mass in the lateral aspect of the left breast. Biopsy demonstrated infiltrating mammary carcinoma. Surgery was recommended for definitive treatment. She did have preoperative injection of radioactive technetium sulfur colloid.  With the patient on the operating table in the supine position the left arm was placed on a lateral arm support. The breast and axilla were prepared with ChloraPrep and draped in a sterile manner.  Then mass was palpable at the 3:00 position. A curvilinear incision was made from the 2:00 position to the 4:00 position some 5 cm from the nipple removing an ellipse of skin with the underlying specimen. The dissection was carried down through subcutaneous tissues and around the palpable mass which was deep within the dense breast tissue. As the mass was being resected margin maps were sutured to the medial lateral cranial caudal and deep margins. The specimen was submitted for pathology. The wound was inspected and several small bleeding points were cauterized. One clamped bleeding point was suture ligated with 4-0 chromic.  The gamma counter was used to examine the axilla and demonstrated location of radioactivity in the inferior aspect of the axilla. An oblique incision was made in the inferior aspect of the axilla and carried down through subcutaneous tissues. Multiple small bleeding points were cauterized. The gamma counter was used for additional direction and dissected down to encounter a focal area of radioactivity deep within the axilla adjacent to the rib cage. There was a palpable lymph node found. This was removed with some surrounding fatty tissue. The ex vivo counts per second  was in the range of 800-900. The background count was in the range of 0-10. The specimen was submitted fresh for routine pathology. The wound was inspected and could see hemostasis was intact.  The pathologist called to report that the medial margin was close. The medial margin was inspected within the wound and did not identify any tumor. The medial margin was re-excised removing a portion of tissue the medial margin which was approximately 4 x 4 by 1 cm in dimension and placed on Telfa so that the old margin was on the Telfa and a stitch was placed at the anterior border. The pathologist reported some scar tissue but did not identify any tumor and initial inspection of the final medial margin.  The breast wound was further inspected and determined hemostasis was intact. Deeper glandular tissue was approximated with interrupted 3-0 chromic. Subcutaneous tissues were approximated with interrupted 4-0 chromic. The skin was closed with running 4-0 Monocryl subcuticular suture.  The axillary wound was further inspected and could see hemostasis was intact. The skin was closed with running 4-0 Monocryl subcuticular suture.  Both wounds were treated with Dermabond. The patient appeared to tolerate the procedure well and was then prepared for transfer to the recovery room  [facility].D.

## 2017-03-09 NOTE — Transfer of Care (Signed)
Immediate Anesthesia Transfer of Care Note  Patient: Dana Harrell  Procedure(s) Performed: Procedure(s): MASTECTOMY PARTIAL (Left) SENTINEL NODE BIOPSY (Left)  Patient Location: PACU  Anesthesia Type:General  Level of Consciousness: awake, alert  and oriented  Airway & Oxygen Therapy: Patient Spontanous Breathing  Post-op Assessment: Report given to RN, Post -op Vital signs reviewed and stable, Patient moving all extremities and Patient able to stick tongue midline  Post vital signs: Reviewed  Last Vitals:  Vitals:   03/09/17 0825 03/09/17 1233  BP: (!) 149/85 124/77  Pulse: 77 (!) 123  Resp: 16 18  Temp: 36.4 C 36.7 C    Last Pain:  Vitals:   03/09/17 0825  TempSrc: Tympanic         Complications: No apparent anesthesia complications

## 2017-03-09 NOTE — Anesthesia Postprocedure Evaluation (Signed)
Anesthesia Post Note  Patient: Dana Harrell  Procedure(s) Performed: Procedure(s) (LRB): MASTECTOMY PARTIAL (Left) SENTINEL NODE BIOPSY (Left)  Patient location during evaluation: PACU Anesthesia Type: General Level of consciousness: awake and alert and oriented Pain management: pain level controlled Vital Signs Assessment: post-procedure vital signs reviewed and stable Respiratory status: spontaneous breathing Cardiovascular status: blood pressure returned to baseline Anesthetic complications: no     Last Vitals:  Vitals:   03/09/17 1353 03/09/17 1450  BP: (!) 105/58 119/70  Pulse: (!) 105 (!) 108  Resp: 16 16  Temp: 36.9 C     Last Pain:  Vitals:   03/09/17 1450  TempSrc:   PainSc: 4                  Sebastiano Luecke

## 2017-03-09 NOTE — Discharge Instructions (Addendum)
Take Tylenol or Norco if needed for pain.  Should not drive or do anything dangerous when taking Norco.  Wear bra as desired for comfort and support.  May shower.   AMBULATORY SURGERY  DISCHARGE INSTRUCTIONS   1) The drugs that you were given will stay in your system until tomorrow so for the next 24 hours you should not:  A) Drive an automobile B) Make any legal decisions C) Drink any alcoholic beverage   2) You may resume regular meals tomorrow.  Today it is better to start with liquids and gradually work up to solid foods.  You may eat anything you prefer, but it is better to start with liquids, then soup and crackers, and gradually work up to solid foods.   3) Please notify your doctor immediately if you have any unusual bleeding, trouble breathing, redness and pain at the surgery site, drainage, fever, or pain not relieved by medication.    4) Additional Instructions: TAKE A STOOL SOFTENER TWICE A DAY WHILE TAKING NARCOTIC PAIN MEDICINE TO PREVENT CONSTIPATION   Please contact your physician with any problems or Same Day Surgery at [phone], Monday through Friday 6 am to 4 pm, or [place] at [facility] number at [phone].

## 2017-03-10 ENCOUNTER — Encounter: Payer: Self-pay | Admitting: Surgery

## 2017-03-12 ENCOUNTER — Other Ambulatory Visit: Payer: Self-pay | Admitting: Pathology

## 2017-03-12 LAB — SURGICAL PATHOLOGY

## 2017-03-15 ENCOUNTER — Encounter: Payer: Self-pay | Admitting: Oncology

## 2017-03-15 ENCOUNTER — Inpatient Hospital Stay: Payer: BLUE CROSS/BLUE SHIELD | Attending: Oncology | Admitting: Oncology

## 2017-03-15 ENCOUNTER — Inpatient Hospital Stay: Payer: BLUE CROSS/BLUE SHIELD

## 2017-03-15 VITALS — BP 133/80 | HR 74 | Temp 98.5°F | Resp 18 | Wt 179.1 lb

## 2017-03-15 DIAGNOSIS — Z87442 Personal history of urinary calculi: Secondary | ICD-10-CM | POA: Diagnosis not present

## 2017-03-15 DIAGNOSIS — C50412 Malignant neoplasm of upper-outer quadrant of left female breast: Secondary | ICD-10-CM | POA: Diagnosis present

## 2017-03-15 DIAGNOSIS — C50912 Malignant neoplasm of unspecified site of left female breast: Secondary | ICD-10-CM

## 2017-03-15 DIAGNOSIS — F419 Anxiety disorder, unspecified: Secondary | ICD-10-CM | POA: Insufficient documentation

## 2017-03-15 DIAGNOSIS — K219 Gastro-esophageal reflux disease without esophagitis: Secondary | ICD-10-CM | POA: Diagnosis not present

## 2017-03-15 DIAGNOSIS — Z170 Estrogen receptor positive status [ER+]: Secondary | ICD-10-CM | POA: Insufficient documentation

## 2017-03-15 DIAGNOSIS — Z13820 Encounter for screening for osteoporosis: Secondary | ICD-10-CM

## 2017-03-15 DIAGNOSIS — Z79899 Other long term (current) drug therapy: Secondary | ICD-10-CM | POA: Diagnosis not present

## 2017-03-15 DIAGNOSIS — Z803 Family history of malignant neoplasm of breast: Secondary | ICD-10-CM | POA: Diagnosis not present

## 2017-03-15 DIAGNOSIS — Z7189 Other specified counseling: Secondary | ICD-10-CM

## 2017-03-15 DIAGNOSIS — I129 Hypertensive chronic kidney disease with stage 1 through stage 4 chronic kidney disease, or unspecified chronic kidney disease: Secondary | ICD-10-CM | POA: Diagnosis not present

## 2017-03-15 DIAGNOSIS — R748 Abnormal levels of other serum enzymes: Secondary | ICD-10-CM | POA: Diagnosis not present

## 2017-03-15 LAB — COMPREHENSIVE METABOLIC PANEL
ALK PHOS: 149 U/L — AB (ref 38–126)
ALT: 42 U/L (ref 14–54)
AST: 47 U/L — AB (ref 15–41)
Albumin: 4 g/dL (ref 3.5–5.0)
Anion gap: 9 (ref 5–15)
BILIRUBIN TOTAL: 0.6 mg/dL (ref 0.3–1.2)
BUN: 13 mg/dL (ref 6–20)
CO2: 26 mmol/L (ref 22–32)
Calcium: 9.5 mg/dL (ref 8.9–10.3)
Chloride: 102 mmol/L (ref 101–111)
Creatinine, Ser: 0.73 mg/dL (ref 0.44–1.00)
GFR calc Af Amer: >60 mL/min (ref >60)
Glucose, Bld: 218 mg/dL — ABNORMAL HIGH (ref 65–99)
Potassium: 3.5 mmol/L (ref 3.5–5.1)
Sodium: 137 mmol/L (ref 135–145)
TOTAL PROTEIN: 7.4 g/dL (ref 6.5–8.1)

## 2017-03-15 LAB — CBC WITH DIFFERENTIAL/PLATELET
BASOS ABS: 0.1 10*3/uL (ref 0–0.1)
Basophils Relative: 1 %
Eosinophils Absolute: 0.4 10*3/uL (ref 0–0.7)
Eosinophils Relative: 5 %
HEMATOCRIT: 35.4 % (ref 35.0–47.0)
Hemoglobin: 12.2 g/dL (ref 12.0–16.0)
LYMPHS PCT: 24 %
Lymphs Abs: 2 10*3/uL (ref 1.0–3.6)
MCH: 29.6 pg (ref 26.0–34.0)
MCHC: 34.4 g/dL (ref 32.0–36.0)
MCV: 86 fL (ref 80.0–100.0)
MONO ABS: 0.8 10*3/uL (ref 0.2–0.9)
Monocytes Relative: 10 %
Neutro Abs: 4.8 10*3/uL (ref 1.4–6.5)
Neutrophils Relative %: 60 %
Platelets: 218 10*3/uL (ref 150–440)
RBC: 4.12 MIL/uL (ref 3.80–5.20)
RDW: 13.3 % (ref 11.5–14.5)
WBC: 8.1 10*3/uL (ref 3.6–11.0)

## 2017-03-15 NOTE — Progress Notes (Signed)
Patient had her breast surgery on Friday and is here today to discuss treatment options.

## 2017-03-15 NOTE — Progress Notes (Signed)
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 Patient is a 51 year old premenopausal female who noticed a palpable left breast mass in April 2018. Her prior mammogram was in 2015 which was normal. She has not had any personal history of breast cancer or prior breast biopsies. She does have a strong family history of breast cancer in her maternal aunts. 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 Recent mammogram on 02/02/2017 showed: There is a lobular mass within the upper-outer left breast underlying the palpable the marker. Mass measures 2.5 x 2.1 cm on mammography. No additional concerning masses, calcifications or nonsurgical distortion identified within either breast.  Mammographic images were processed with CAD.  On physical exam, I palpate a firm mass within the upper-outer left breast.  Targeted ultrasound is performed, showing a 2.0 x 1.8 x 1.6 cm irregular hypoechoic mass left breast 3 o'clock position 4 cm from nipple, corresponding with palpable abnormality. No left axillary adenopathy.  3. 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 ER PR positive her 2 negative   4. Patient is premenopausal. She is G5 P5 L5. Did not use birth control and did not breast feed. Menarche at the age of 67.  28. Patient underwent lumpectomy  with SLNB on 03/09/17 which showed: DIAGNOSIS:  A. LEFT BREAST MASS, 3:00; EXCISION:  - INVASIVE MAMMARY CARCINOMA OF NO SPECIAL TYPE.  - HISTOLOGIC CHANGES CONSISTENT WITH BIOPSY SITE.  - ATYPICAL LOBULAR HYPERPLASIA.  - THE SURGICAL MARGINS ARE NEGATIVE.   B. LEFT BREAST, MEDIAL MARGIN; REEXCISION:  - NEGATIVE FOR INVASIVE CARCINOMA.  - ATYPICAL LOBULAR HYPERPLASIA.  - FIBROCYSTIC CHANGE.   C. SENTINEL LYMPH NODE, LEFT AXILLA; EXCISION:  - NO TUMOR SEEN IN ONE LYMPH NODE (0/1).  - SEE SUMMARY BELOW.   Surgical Pathology Cancer Case Summary   INVASIVE CARCINOMA OF THE BREAST  Procedure: Lumpectomy  Specimen Laterality: Left  Histologic Type: Invasive carcinoma of no special type  Histologic Grade (Nottingham Histologic Score)       Glandular (Acinar)/Tubular Differentiation: 3       Nuclear Pleomorphism: 2       Mitotic Rate: 2       Overall Grade: 2  Tumor Size: 23 mm  Ductal Carcinoma in Situ (DCIS): Not identified  Lymphovascular Invasion: Not identified  Treatment Effect: Not applicable  Margins:       Invasive carcinoma: Uninvolved by invasive carcinoma         Distance from closest margin: 3 mm from posterior margin   Regional Lymph nodes:    Total # lymph nodes examined: 1    # Sentinel lymph nodes examined: 1    # Lymph nodes with macrometastasis (>2.0 mm): 0    # Lymph nodes with isolated tumor cells (<0.2 mm): 0    # Lymph nodes with micrometastasis (> 0.2 mm and < 2.0 mm): 0   Pathologic  Stage Classification (pTNM, AJCC 7th Edition)    TNM Descriptors: Not applicable    pTNM: pT2 pN0(sn)   Interval history- she is healing well since her lumpectomy. Denies any complaints today  ECOG PS- 0 Pain scale- 0 Opioid associated constipation- no  Review of systems- Review of Systems  Constitutional: Negative for chills, fever, malaise/fatigue and weight loss.  HENT: Negative for congestion, ear discharge  and nosebleeds.   Eyes: Negative for blurred vision.  Respiratory: Negative for cough, hemoptysis, sputum production, shortness of breath and wheezing.   Cardiovascular: Negative for chest pain, palpitations, orthopnea and claudication.  Gastrointestinal: Negative for abdominal pain, blood in stool, constipation, diarrhea, heartburn, melena, nausea and vomiting.  Genitourinary: Negative for dysuria, flank pain, frequency, hematuria and urgency.  Musculoskeletal: Negative for back pain, joint pain and myalgias.  Skin: Negative for rash.  Neurological: Negative for dizziness, tingling, focal weakness, seizures, weakness and headaches.  Endo/Heme/Allergies: Does not bruise/bleed easily.  Psychiatric/Behavioral: Negative for depression and suicidal ideas. The patient does not have insomnia.       Allergies  Allergen Reactions  . Lovastatin Other (See Comments)    Chest pain  . Motrin [Ibuprofen] Other (See Comments)    Hx of renal failure  . Promethazine Other (See Comments)    Excessive sleepiness.     Past Medical History:  Diagnosis Date  . Anxiety   . Cancer ([place])   . Chronic kidney disease 2009   H/O ACUTE KIDNEY FAILURE WITH RHABDOMYOLOSIS  . GERD (gastroesophageal reflux disease)   . History of kidney stones   . Hypertension   . Rhabdomyolysis    2009  . UTI (urinary tract infection)      Past Surgical History:  Procedure Laterality Date  . CHOLECYSTECTOMY    . COLONOSCOPY     4/17  . MASTECTOMY, PARTIAL Left 03/09/2017   Procedure: MASTECTOMY PARTIAL;  Surgeon: Leonie Green, MD;  Location: [facility] ORS;  Service: General;  Laterality: Left;  . MS MAMMO SCREENING     4/18  . OTHER SURGICAL HISTORY     pap 4/18  . SENTINEL NODE BIOPSY Left 03/09/2017   Procedure: SENTINEL NODE BIOPSY;  Surgeon: Leonie Green, MD;  Location: [facility] ORS;  Service: General;  Laterality: Left;    Social History   Social History  . Marital status: Married    Spouse name:  N/A  . Number of children: N/A  . Years of education: N/A   Occupational History  . Not on file.   Social History Main Topics  . Smoking status: Never Smoker  . Smokeless tobacco: Never Used  . Alcohol use Yes     Comment: socially  . Drug use: No  . Sexual activity: Not on file   Other Topics Concern  . Not on file   Social History Narrative  . No narrative on file    Family History  Problem Relation Age of Onset  . Colon cancer Mother 56  . Breast cancer Maternal Aunt        3 materal aunts all 10's  . Breast cancer Maternal Aunt   . Breast cancer Maternal Aunt      Current Outpatient Prescriptions:  .  acetaminophen (TYLENOL) 500 MG tablet, Take 1,000 mg by mouth every 6 (six) hours as needed., Disp: , Rfl:  .  amLODipine (NORVASC) 10 MG tablet, Take 10 mg by mouth every morning. , Disp: , Rfl:  .  HYDROcodone-acetaminophen (NORCO) 5-325 MG tablet, Take 1-2  tablets by mouth every 4 (four) hours as needed for moderate pain., Disp: 12 tablet, Rfl: 0 .  Omega-3 Fatty Acids (FISH OIL) 1000 MG CAPS, Take 1,000 mg by mouth daily., Disp: , Rfl:  .  RABEprazole (ACIPHEX) 20 MG tablet, Take 20 mg by mouth daily., Disp: , Rfl:  .  sertraline (ZOLOFT) 50 MG tablet, Take 50 mg by mouth every morning. , Disp: , Rfl:   Physical exam:  Vitals:   03/15/17 0912  BP: 133/80  Pulse: 74  Resp: 18  Temp: 98.5 F (36.9 C)  TempSrc: Tympanic  Weight: 179 lb 2 oz (81.3 kg)   Physical Exam  Constitutional: She is oriented to person, place, and time and well-developed, well-nourished, and in no distress.  HENT:  Head: Normocephalic and atraumatic.  Eyes: EOM are normal. Pupils are equal, round, and reactive to light.  Neck: Normal range of motion.  Cardiovascular: Normal rate and regular rhythm.   Murmur (systolic murmur +) heard. Pulmonary/Chest: Effort normal and breath sounds normal.  Abdominal: Soft. Bowel sounds are normal.  Neurological: She is alert and oriented to  person, place, and time.  Skin: Skin is warm and dry.   Breast exam was performed in seated and lying down position. Patient is status post left lumpectomy with a well-healed surgical scar. No evidence of any palpable masses. No evidence of axillary adenopathy. No evidence of any palpable masses or lumps in the right breast. No evidence of right axillary adenopathy   CMP Latest Ref Rng & Units 03/15/2017  Glucose 65 - 99 mg/dL 218(H)  BUN 6 - 20 mg/dL 13  Creatinine 0.44 - 1.00 mg/dL 0.73  Sodium 135 - 145 mmol/L 137  Potassium 3.5 - 5.1 mmol/L 3.5  Chloride 101 - 111 mmol/L 102  CO2 22 - 32 mmol/L 26  Calcium 8.9 - 10.3 mg/dL 9.5  Total Protein 6.5 - 8.1 g/dL 7.4  Total Bilirubin 0.3 - 1.2 mg/dL 0.6  Alkaline Phos 38 - 126 U/L 149(H)  AST 15 - 41 U/L 47(H)  ALT 14 - 54 U/L 42   CBC Latest Ref Rng & Units 03/15/2017  WBC 3.6 - 11.0 K/uL 8.1  Hemoglobin 12.0 - 16.0 g/dL 12.2  Hematocrit 35.0 - 47.0 % 35.4  Platelets 150 - 440 K/uL 218    No images are attached to the encounter.  Mr Breast Bilateral W Wo Contrast  Result Date: 02/27/2017 CLINICAL DATA:  Known left breast malignancy. LABS:  Creatinine 0.9.  GFR greater than 60. EXAM: BILATERAL BREAST MRI WITH AND WITHOUT CONTRAST TECHNIQUE: Multiplanar, multisequence MR images of both breasts were obtained prior to and following the intravenous administration of 17 ml of MultiHance. THREE-DIMENSIONAL MR IMAGE RENDERING ON INDEPENDENT WORKSTATION: Three-dimensional MR images were rendered by post-processing of the original MR data on an independent workstation. The three-dimensional MR images were interpreted, and findings are reported in the following complete MRI report for this study. Three dimensional images were evaluated at the independent DynaCad workstation COMPARISON:  Previous exam(s). FINDINGS: Breast composition: c. Heterogeneous fibroglandular tissue. Background parenchymal enhancement: Moderate Right breast: No suspicious  enhancing masses in the right breast. Several small homogeneously enhancing T2 bright masses are identified consistent with a benign etiology. Left breast: The patient's known malignancy in the left breast measures 1.7 x 2.1 cm on series 4, image 36. Multiple small homogeneously enhancing T2 bright masses in the left breast are similar to those on the right, consistent with a benign etiology. No other suspicious  findings. Lymph nodes: No abnormal appearing lymph nodes. Ancillary findings:  None. IMPRESSION: 1. Known malignancy in the left breast. No other MRI evidence of malignancy identified on today's study. RECOMMENDATION: Continued surgical and oncologic follow up. BI-RADS CATEGORY  6: Known biopsy-proven malignancy. Electronically Signed   By: Dorise Bullion III M.D   On: 02/27/2017 16:11   Nm Sentinel Node Injection  Result Date: 03/09/2017 CLINICAL DATA:  Left breast cancer. EXAM: NUCLEAR MEDICINE BREAST LYMPHOSCINTIGRAPHY TECHNIQUE: Intradermal injection of radiopharmaceutical was performed at the 12 o'clock, 3 o'clock, 6 o'clock, and 9 o'clock positions around the left nipple. The patient was then sent to the operating room where the sentinel node(s) were identified and removed by the surgeon. RADIOPHARMACEUTICALS:  676 millicuries mCi Millipore-filtered Technetium-32msulfur colloid, injected in four aliquots. IMPRESSION: Uncomplicated intradermal injection of Technetium-971mulfur colloid for purposes of sentinel node identification. Electronically Signed   By: AdMarkus Daft.D.   On: 03/09/2017 11:14     Assessment and plan- Patient is a [age].o. female with stage IA pT2N0M0 invasive mammary carcinoma of the left breast status post lumpectomy ER/PR positive and HER-2/neu negative  I discussed the results of the pathology with the patient in detail. She has a stage IA breast cancer based on AJCC 8 pathologic prognostic staging. Given that her tumor sizes more than 0.5 cm, I would recommend Oncotype  testing to determine if she would benefit from adjuvant chemotherapy. I discussed what Oncotype testing entails and how the results are interpreted. 22[place] had tumor size of 2-2.9 cm. I therefore recommend oncotype testing for her instead of adjuvant chemotherapy.   Based on recent ASCO plenary data on TAILORx trial, if her recurrence score is less than 25 I would not recommend adjuvant chemotherapy for her given that she is more than [age]ears of age. Adjuvant chemotherapy would be recommended if her score is greater than equal to 26. If she needs chemotherapy, I will see her in 2 weeks and discuss risks and benefits of chemotherapy at that time. If she does not require chemotherapy based on her oncotype, I will refer her to [facility] adjuvant radiation.  Given that she has a strongly ER PR positive tumor, She will need adjuvant hormonal therapy for 10 years. Her last definite menstrual period was in March 2018 and since then she has been getting intermittent spotting. She will therefore be premenopausal and will need tamoxifen for 5 years followed by AI for 5 years. I discussed the risks and benefits of hormone therapy including all but not limited to hot flashes, fatigue, mood swings risk of DVT and endometrial cancer. She will need annual Gyn visists and opthal visit for possibility of cataract. Written information about tamoxifen has been given. I will proceed with tamoxifen upon completion of radiation.  She has mildly elevated AST and alk phos. I will therefore obtain [place] and bone scan to r/o mets although the probability is very low for metastatic disease given that she has stage I breast cancer   Visit Diagnosis 1. Malignant neoplasm of upper-outer quadrant of left breast in female, estrogen receptor positive ([place]  2. Goals of care, counseling/discussion   3. Osteoporosis screening   4. Elevated liver enzymes      Dr. ArRanda EvensMD, MPH [facility]- [id number]/04/2017 9:57 AM

## 2017-03-20 ENCOUNTER — Ambulatory Visit
Admission: RE | Admit: 2017-03-20 | Discharge: 2017-03-20 | Disposition: A | Discharge status: Home / Self Care | Payer: BLUE CROSS/BLUE SHIELD | Source: Ambulatory Visit | Attending: Oncology | Admitting: Oncology

## 2017-03-20 DIAGNOSIS — Z170 Estrogen receptor positive status [ER+]: Secondary | ICD-10-CM | POA: Diagnosis present

## 2017-03-20 DIAGNOSIS — Z13820 Encounter for screening for osteoporosis: Secondary | ICD-10-CM

## 2017-03-20 DIAGNOSIS — R748 Abnormal levels of other serum enzymes: Secondary | ICD-10-CM | POA: Diagnosis not present

## 2017-03-20 DIAGNOSIS — C50412 Malignant neoplasm of upper-outer quadrant of left female breast: Secondary | ICD-10-CM | POA: Diagnosis present

## 2017-03-20 DIAGNOSIS — Z7189 Other specified counseling: Secondary | ICD-10-CM | POA: Diagnosis not present

## 2017-03-21 ENCOUNTER — Encounter
Admission: RE | Admit: 2017-03-21 | Discharge: 2017-03-21 | Disposition: A | Discharge status: Home / Self Care | Payer: BLUE CROSS/BLUE SHIELD | Source: Ambulatory Visit | Attending: Oncology | Admitting: Oncology

## 2017-03-21 ENCOUNTER — Other Ambulatory Visit
Admission: RE | Admit: 2017-03-21 | Discharge: 2017-03-21 | Disposition: A | Discharge status: Home / Self Care | Payer: BLUE CROSS/BLUE SHIELD | Source: Ambulatory Visit | Attending: Oncology | Admitting: Oncology

## 2017-03-21 ENCOUNTER — Other Ambulatory Visit: Payer: Self-pay | Admitting: *Deleted

## 2017-03-21 DIAGNOSIS — Z170 Estrogen receptor positive status [ER+]: Secondary | ICD-10-CM | POA: Diagnosis present

## 2017-03-21 DIAGNOSIS — C50412 Malignant neoplasm of upper-outer quadrant of left female breast: Secondary | ICD-10-CM | POA: Diagnosis present

## 2017-03-21 DIAGNOSIS — R748 Abnormal levels of other serum enzymes: Secondary | ICD-10-CM

## 2017-03-21 DIAGNOSIS — Z13820 Encounter for screening for osteoporosis: Secondary | ICD-10-CM | POA: Diagnosis present

## 2017-03-21 DIAGNOSIS — Z01812 Encounter for preprocedural laboratory examination: Secondary | ICD-10-CM | POA: Insufficient documentation

## 2017-03-21 DIAGNOSIS — Z7189 Other specified counseling: Secondary | ICD-10-CM | POA: Diagnosis present

## 2017-03-21 LAB — PREGNANCY, URINE: PREG TEST UR: NEGATIVE

## 2017-03-21 MED ORDER — TECHNETIUM TC 99M MEDRONATE IV KIT
25.0000 | PACK | Freq: Once | INTRAVENOUS | Status: AC | PRN
  Administered 2017-03-21: 19.03 via INTRAVENOUS

## 2017-03-23 ENCOUNTER — Encounter: Payer: Self-pay | Admitting: Oncology

## 2017-03-30 ENCOUNTER — Inpatient Hospital Stay: Payer: BLUE CROSS/BLUE SHIELD | Admitting: Oncology

## 2017-04-10 ENCOUNTER — Inpatient Hospital Stay: Payer: BLUE CROSS/BLUE SHIELD | Attending: Oncology | Admitting: Oncology

## 2017-04-10 VITALS — BP 115/75 | HR 18 | Temp 98.5°F | Resp 18 | Wt 178.2 lb

## 2017-04-10 DIAGNOSIS — Z87448 Personal history of other diseases of urinary system: Secondary | ICD-10-CM | POA: Diagnosis not present

## 2017-04-10 DIAGNOSIS — Z803 Family history of malignant neoplasm of breast: Secondary | ICD-10-CM

## 2017-04-10 DIAGNOSIS — Z9012 Acquired absence of left breast and nipple: Secondary | ICD-10-CM | POA: Diagnosis not present

## 2017-04-10 DIAGNOSIS — Z87442 Personal history of urinary calculi: Secondary | ICD-10-CM | POA: Diagnosis not present

## 2017-04-10 DIAGNOSIS — Z79899 Other long term (current) drug therapy: Secondary | ICD-10-CM | POA: Diagnosis not present

## 2017-04-10 DIAGNOSIS — C50412 Malignant neoplasm of upper-outer quadrant of left female breast: Secondary | ICD-10-CM

## 2017-04-10 DIAGNOSIS — Z170 Estrogen receptor positive status [ER+]: Secondary | ICD-10-CM

## 2017-04-10 DIAGNOSIS — K219 Gastro-esophageal reflux disease without esophagitis: Secondary | ICD-10-CM

## 2017-04-10 DIAGNOSIS — F419 Anxiety disorder, unspecified: Secondary | ICD-10-CM

## 2017-04-10 DIAGNOSIS — I10 Essential (primary) hypertension: Secondary | ICD-10-CM

## 2017-04-10 DIAGNOSIS — Z8739 Personal history of other diseases of the musculoskeletal system and connective tissue: Secondary | ICD-10-CM | POA: Diagnosis not present

## 2017-04-10 NOTE — Progress Notes (Signed)
Patient offers no complaints today.  Patient here today for scan results.

## 2017-04-13 ENCOUNTER — Encounter: Payer: Self-pay | Admitting: Oncology

## 2017-04-13 NOTE — Progress Notes (Addendum)
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 Patient is a 51 year old premenopausal female who noticed a palpable left breast mass in April 2018. Her prior mammogram was in 2015 which was normal. She has not had any personal history of breast cancer or prior breast biopsies. She does have a strong family history of breast cancer in her maternal aunts. She did undergo genetic testing for her treatment over area and cancer in the past and genetic testing showed ATM [id number] of uncertain significance   2. Recent mammogram on 02/02/2017 showed: There is a lobular mass within the upper-outer left breast underlying the palpable the marker. Mass measures 2.5 x 2.1 cm on mammography. No additional concerning masses, calcifications or nonsurgical distortion identified within either breast.  Mammographic images were processed with CAD.  On physical exam, I palpate a firm mass within the upper-outer left breast.  Targeted ultrasound is performed, showing a 2.0 x 1.8 x 1.6 cm irregular hypoechoic mass left breast 3 o'clock position 4 cm from nipple, corresponding with palpable abnormality. No left axillary adenopathy.  3. 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 ER PR positive her 2 negative   4. Patient is premenopausal. She is G5 P5 L5. Did not use birth control and did not breast feed. Menarche at the age of 29.  29. Patient underwent  lumpectomy with SLNB on 03/09/17 which showed: DIAGNOSIS:  A. LEFT BREAST MASS, 3:00; EXCISION:  - INVASIVE MAMMARY CARCINOMA OF NO SPECIAL TYPE.  - HISTOLOGIC CHANGES CONSISTENT WITH BIOPSY SITE.  - ATYPICAL LOBULAR HYPERPLASIA.  - THE SURGICAL MARGINS ARE NEGATIVE.   B. LEFT BREAST, MEDIAL MARGIN; REEXCISION:  - NEGATIVE FOR INVASIVE CARCINOMA.  - ATYPICAL LOBULAR HYPERPLASIA.  - FIBROCYSTIC CHANGE.   C. SENTINEL LYMPH NODE, LEFT AXILLA; EXCISION:  - NO TUMOR SEEN IN ONE LYMPH NODE (0/1).  - SEE SUMMARY BELOW.   Surgical Pathology Cancer Case Summary   INVASIVE CARCINOMA OF THE BREAST  Procedure: Lumpectomy  Specimen Laterality: Left  Histologic Type: Invasive carcinoma of no special type  Histologic Grade (Nottingham Histologic Score)       Glandular (Acinar)/Tubular Differentiation: 3       Nuclear Pleomorphism: 2       Mitotic Rate: 2       Overall Grade: 2  Tumor Size: 23 mm  Ductal Carcinoma in Situ (DCIS): Not identified  Lymphovascular Invasion: Not identified  Treatment Effect: Not applicable  Margins:       Invasive carcinoma: Uninvolved by invasive carcinoma         Distance from closest margin: 3 mm from posterior margin   Regional Lymph nodes:    Total # lymph nodes examined: 1    # Sentinel lymph nodes examined: 1    # Lymph nodes with macrometastasis (>2.0 mm): 0    # Lymph nodes with isolated tumor cells (<0.2 mm): 0    # Lymph nodes with micrometastasis (> 0.2 mm and < 2.0 mm): 0   Pathologic Stage  Classification (pTNM, AJCC 7th Edition)    TNM Descriptors: Not applicable    pTNM: pT2 pN0(sn)   6. Oncotype testing showed intermediate score of 21  Interval history- doing well. Denies any complaints. She did not have menstrual cycles for 3 months and then recently had it few weeks back  ECOG PS- 0 Pain scale- 0   Review of systems- Review of Systems  Constitutional: Negative for chills,  fever, malaise/fatigue and weight loss.  HENT: Negative for congestion, ear discharge and nosebleeds.   Eyes: Negative for blurred vision.  Respiratory: Negative for cough, hemoptysis, sputum production, shortness of breath and wheezing.   Cardiovascular: Negative for chest pain, palpitations, orthopnea and claudication.  Gastrointestinal: Negative for abdominal pain, blood in stool, constipation, diarrhea, heartburn, melena, nausea and vomiting.  Genitourinary: Negative for dysuria, flank pain, frequency, hematuria and urgency.  Musculoskeletal: Negative for back pain, joint pain and myalgias.  Skin: Negative for rash.  Neurological: Negative for dizziness, tingling, focal weakness, seizures, weakness and headaches.  Endo/Heme/Allergies: Does not bruise/bleed easily.  Psychiatric/Behavioral: Negative for depression and suicidal ideas. The patient does not have insomnia.       Allergies  Allergen Reactions  . Lovastatin Other (See Comments)    Chest pain  . Motrin [Ibuprofen] Other (See Comments)    Hx of renal failure  . Promethazine Other (See Comments)    Excessive sleepiness.     Past Medical History:  Diagnosis Date  . Anxiety   . Cancer ([place])   . Chronic kidney disease 2009   H/O ACUTE KIDNEY FAILURE WITH RHABDOMYOLOSIS  . GERD (gastroesophageal reflux disease)   . History of kidney stones   . Hypertension   . Rhabdomyolysis    2009  . UTI (urinary tract infection)      Past Surgical History:  Procedure Laterality Date  . CHOLECYSTECTOMY    . COLONOSCOPY     4/17  . MASTECTOMY, PARTIAL Left 03/09/2017   Procedure: MASTECTOMY PARTIAL;  Surgeon: Leonie Green, MD;  Location: [facility] ORS;  Service: General;  Laterality: Left;  . MS MAMMO SCREENING     4/18  . OTHER SURGICAL HISTORY     pap 4/18  . SENTINEL NODE BIOPSY Left 03/09/2017   Procedure: SENTINEL NODE BIOPSY;  Surgeon: Leonie Green, MD;  Location: [facility] ORS;  Service: General;  Laterality: Left;     Social History   Social History  . Marital status: Married    Spouse name: N/A  . Number of children: N/A  . Years of education: N/A   Occupational History  . Not on file.   Social History Main Topics  . Smoking status: Never Smoker  . Smokeless tobacco: Never Used  . Alcohol use Yes     Comment: socially  . Drug use: No  . Sexual activity: Not on file   Other Topics Concern  . Not on file   Social History Narrative  . No narrative on file    Family History  Problem Relation Age of Onset  . Colon cancer Mother 31  . Breast cancer Maternal Aunt        3 materal aunts all 66's  . Breast cancer Maternal Aunt   . Breast cancer Maternal Aunt      Current Outpatient Prescriptions:  .  acetaminophen (TYLENOL) 500 MG tablet, Take 1,000 mg by mouth every 6 (six) hours as needed., Disp: , Rfl:  .  amLODipine (NORVASC) 10 MG tablet, Take 10 mg by mouth  every morning. , Disp: , Rfl:  .  HYDROcodone-acetaminophen (NORCO) 5-325 MG tablet, Take 1-2 tablets by mouth every 4 (four) hours as needed for moderate pain., Disp: 12 tablet, Rfl: 0 .  Omega-3 Fatty Acids (FISH OIL) 1000 MG CAPS, Take 1,000 mg by mouth daily., Disp: , Rfl:  .  RABEprazole (ACIPHEX) 20 MG tablet, Take 20 mg by mouth daily., Disp: , Rfl:  .  sertraline (ZOLOFT) 50 MG tablet, Take 50 mg by mouth every morning. , Disp: , Rfl:   Physical exam:  Vitals:   04/10/17 1443  BP: 115/75  Pulse: (!) 18  Resp: 18  Temp: 98.5 F (36.9 C)  TempSrc: Tympanic  Weight: 178 lb 3 oz (80.8 kg)   Physical Exam  Constitutional: She is oriented to person, place, and time and well-developed, well-nourished, and in no distress.  HENT:  Head: Normocephalic and atraumatic.  Eyes: EOM are normal. Pupils are equal, round, and reactive to light.  Neck: Normal range of motion.  Cardiovascular: Normal rate, regular rhythm and normal heart sounds.   Pulmonary/Chest: Effort normal and breath sounds normal.  Abdominal: Soft.  Bowel sounds are normal.  Neurological: She is alert and oriented to person, place, and time.  Skin: Skin is warm and dry.     CMP Latest Ref Rng & Units 03/15/2017  Glucose 65 - 99 mg/dL 218(H)  BUN 6 - 20 mg/dL 13  Creatinine 0.44 - 1.00 mg/dL 0.73  Sodium 135 - 145 mmol/L 137  Potassium 3.5 - 5.1 mmol/L 3.5  Chloride 101 - 111 mmol/L 102  CO2 22 - 32 mmol/L 26  Calcium 8.9 - 10.3 mg/dL 9.5  Total Protein 6.5 - 8.1 g/dL 7.4  Total Bilirubin 0.3 - 1.2 mg/dL 0.6  Alkaline Phos 38 - 126 U/L 149(H)  AST 15 - 41 U/L 47(H)  ALT 14 - 54 U/L 42   CBC Latest Ref Rng & Units 03/15/2017  WBC 3.6 - 11.0 K/uL 8.1  Hemoglobin 12.0 - 16.0 g/dL 12.2  Hematocrit 35.0 - 47.0 % 35.4  Platelets 150 - 440 K/uL 218    No images are attached to the encounter.  Nm Bone Scan Whole Body  Result Date: 03/21/2017 CLINICAL DATA:  Recently diagnosed breast cancer, elevated alkaline phosphatase. EXAM: NUCLEAR MEDICINE WHOLE BODY BONE SCAN TECHNIQUE: Whole body anterior and posterior images were obtained approximately 3 hours after intravenous injection of radiopharmaceutical. RADIOPHARMACEUTICALS:  19.03 mCi Technetium-37mMDP IV COMPARISON:  None. FINDINGS: Degenerative uptake is seen at the bilateral shoulders, knees, ankles and feet. Probable additional degenerative uptake at the sternoclavicular joints. Focal mild radiotracer uptake is seen within the lower cervical spine, with corresponding degenerative disc disease appreciated in the lower cervical spine on a plain film of the thoracic spine dated 04/13/2011. Additional mild degenerative uptake is seen within the lower lumbar spine. No evidence of osseous metastasis seen. Soft tissue uptake in the left breast is consistent with patient's recently diagnosed breast cancer. IMPRESSION: 1. No evidence of osseous metastasis. 2. Degenerative uptake within the axial skeleton and extremities as detailed above. Electronically Signed   By: SFranki CabotM.D.   On:  03/21/2017 15:39   Dg Bone Density  Result Date: 03/20/2017 EXAM: DUAL X-RAY ABSORPTIOMETRY (DXA) FOR BONE MINERAL DENSITY IMPRESSION: Dear Dr ARanda Evens Your patient DTariyah Pendrycompleted a BMD test on 03/20/2017 using the [place](analysis version: 14.10) manufactured by [employer] The following summarizes the results of our evaluation. PATIENT BIOGRAPHICAL:  Name: Dana Harrell, Dana Harrell Patient ID: [id number] Birth Date: May 27, 1966 Height: 66.0 in. Gender: Female Exam Date: 03/20/2017 Weight: 177.0 lbs. Indications: Caucasian, History of Fracture (Adult), hx breast ca, PREmenopausal Fractures: lt ankle Treatments: ASSESSMENT: The BMD measured at AP Spine L1-L2 is 1.207 g/cm2 with a T-score of 0.3. This patient is considered normal according to [facility]) criteria. L3&4 were excluded due to degenerative changes. Patient is not a candidate for FRAX assessment due to normal report. The BMD measured at Femur Total Left is 1.045 g/cm2 with a T-score of 0.3. This patient is considered normal according to [facility]) criteria. Site Region Measured Measured WHO Young Adult BMD Date       Age      Classification T-score AP Spine L1-L2 03/20/2017 53.3 Normal 0.3 1.207 g/cm2 DualFemur Total Left 03/20/2017 53.3 Normal 0.3 1.045 g/cm2 World Health Organization [facility]) criteria for post-menopausal, Caucasian Women: Normal:       T-score at or above -1 SD Osteopenia:   T-score between -1 and -2.5 SD Osteoporosis: T-score at or below -2.5 SD RECOMMENDATIONS: [place] recommends that FDA-approved medical therapies be considered in postemenopausal women and men age 53 or older with a: 1. Hip or vertebral (clinical or morphometric) fracture. 2. T-score of < -2.5at the spine or hip. 3. Ten-year fracture probability by FRAX of 3% or greater for hip fracture or 20% or greater for major osteoporotic fracture. All treatment decisions require  clinical judgment and consideration of individual patient factors, including patient preferences, co-morbidities, previous drug use, risk factors not captured in the FRAX model (e.g. falls, vitamin D deficiency, increased bone turnover, interval significant decline in bone density) and possible under - or over-estimation of fracture risk by FRAX. All patients should ensure an adequate intake of dietary calcium (1200 mg/d) and vitamin D (800 IU daily) unless contraindicated. FOLLOW-UP: People with diagnosed cases of osteoporosis or at high risk for fracture should have regular bone mineral density tests. For patients eligible for Medicare, routine testing is allowed once every 2 years. The testing frequency can be increased to one year for patients who have rapidly progressing disease, those who are receiving or discontinuing medical therapy to restore bone mass, or have additional risk factors. I have reviewed this report, and agree with the above findings. [facility] Electronically Signed   By: Lowella Grip III M.D.   On: 03/20/2017 09:00   US Abdomen Limited Ruq  Result Date: 03/21/2017 CLINICAL DATA:  Elevated liver function tests and right upper quadrant pain. EXAM: ULTRASOUND ABDOMEN LIMITED RIGHT UPPER QUADRANT COMPARISON:  02/23/2010 FINDINGS: Gallbladder: Surgically absent Common bile duct: Diameter: 6 mm. Where visualized, no filling defect. Liver: Upper liver is less well seen due to narrow sonographic windows. No evidence of mass lesion. Normal echogenicity. Antegrade flow in the imaged hepatic and portal venous system. IMPRESSION: Negative scan after cholecystectomy. Electronically Signed   By: Monte Fantasia M.D.   On: 03/21/2017 11:53     Assessment and plan- Patient is a 51 y.o. female with newly diagnosed stage IA pT2 N0 M0 invasive mammary carcinoma of the left breast ER/PR positive HER-2/neu negative   Discussed results of oncotype testing which showed intermediate score  of 21. Patient is perimenopausal, >62 years of age. Tumor did not have other high risk characteristics- NO LVI, grade 2, negative margins ans strongly estrogen positive tumor. Hence based on recent TAILORx asco plenary data, I do not think she will have meaningful benefit from  adjuvant chemotherapy. I would teherfore recommend adjuvant radiation and hormone therapy for her  I will refer her to radiation Oncology for adjuvant radiation at this time  Given that she is perimenopausal but still gets her menstrual cycles intermittently, I would recommend tamoxifen for adjuvant hormonal therapy for 5-10 years. Discussed risks and benefits of tamoxifen including all but not limited to fatigue, mood swings, risk of endometrial cancer, DVT and cataracts. Patient understands and agrees to proceed. I will meet her in 1 months time towards the end of her radiation and plan to start hormone therapy at that time. Given her age and the fact that she did not require adjuvant chemotherapy, I doubt she will benefit from AI+ OS per SOFT/TEXT trial  Patient verbalized understanding of the above plan   Visit Diagnosis 1. Malignant neoplasm of upper-outer quadrant of left breast in female, estrogen receptor positive ([place])      Dr. Randa Evens, MD, MPH [place] [facility] Pager- [phone] 04/13/2017 8:00 AM   ADDENDUM:  I met with the patient again on 04/16/2017. I discussed results of TAILORx intermediate score results presented at [facility] 2018. In patient less than 66 years of age, there was a ~6% DFS benefit at 9 years with chemo endocrine therapy as compared to endocrine therapy alone for recurrence score between 20-25 and her score is 21. No improvement in OS. There was no interaction noted on the basis of menopausal status. Patient is just above 60 years of age and still premenopausal. She may or may not derive that 6% DFS benefit noted in <24 years of age with a score of 21-25. Adjuvant  chemotherapy therefore is not entirely unreasonable in her case but benefits remain uncertain. She would like to think about this and get back to me in a few days time. If she chooses to go through chemotherapy- I would recommend adjuvant taxotere and cytoxan (TX) given IV Q3 weeks X4 cycles. Discussed risks and benefits of chemotherapy including all but not limited to nausea, vomiting, hair loss, fatigue, low blood counts and risk of peripheral neuropathy. Patient understands and will get back to me this week about her decision  Dr. Randa Evens, MD, MPH [facility] at [facility] [phone] 04/16/2017 12:29 PM

## 2017-04-16 ENCOUNTER — Inpatient Hospital Stay: Payer: BLUE CROSS/BLUE SHIELD | Admitting: Oncology

## 2017-04-16 NOTE — Progress Notes (Signed)
See addendum for note from 04/10/17 Dr. Randa Evens, MD, MPH [facility] at [facility] [phone] 04/16/2017 3:28 PM

## 2017-04-17 ENCOUNTER — Telehealth: Payer: Self-pay | Admitting: *Deleted

## 2017-04-17 NOTE — Telephone Encounter (Signed)
Called patient to advise her that Dr. Janese Banks would call her tomorrow to answer questions about chemotherapy.

## 2017-04-20 ENCOUNTER — Ambulatory Visit
Admission: RE | Admit: 2017-04-20 | Discharge: 2017-04-20 | Disposition: A | Discharge status: Home / Self Care | Payer: BLUE CROSS/BLUE SHIELD | Source: Ambulatory Visit | Attending: Radiation Oncology | Admitting: Radiation Oncology

## 2017-04-20 ENCOUNTER — Encounter: Payer: Self-pay | Admitting: Radiation Oncology

## 2017-04-20 VITALS — BP 146/87 | HR 81 | Temp 97.6°F | Wt 175.6 lb

## 2017-04-20 DIAGNOSIS — C50412 Malignant neoplasm of upper-outer quadrant of left female breast: Secondary | ICD-10-CM | POA: Diagnosis present

## 2017-04-20 DIAGNOSIS — F419 Anxiety disorder, unspecified: Secondary | ICD-10-CM | POA: Diagnosis not present

## 2017-04-20 DIAGNOSIS — Z79899 Other long term (current) drug therapy: Secondary | ICD-10-CM | POA: Insufficient documentation

## 2017-04-20 DIAGNOSIS — Z803 Family history of malignant neoplasm of breast: Secondary | ICD-10-CM | POA: Insufficient documentation

## 2017-04-20 DIAGNOSIS — Z9049 Acquired absence of other specified parts of digestive tract: Secondary | ICD-10-CM | POA: Insufficient documentation

## 2017-04-20 DIAGNOSIS — Z9012 Acquired absence of left breast and nipple: Secondary | ICD-10-CM | POA: Diagnosis not present

## 2017-04-20 DIAGNOSIS — Z87448 Personal history of other diseases of urinary system: Secondary | ICD-10-CM | POA: Insufficient documentation

## 2017-04-20 DIAGNOSIS — Z510 Encounter for antineoplastic radiation therapy: Secondary | ICD-10-CM | POA: Insufficient documentation

## 2017-04-20 DIAGNOSIS — N189 Chronic kidney disease, unspecified: Secondary | ICD-10-CM | POA: Diagnosis not present

## 2017-04-20 DIAGNOSIS — Z8742 Personal history of other diseases of the female genital tract: Secondary | ICD-10-CM | POA: Diagnosis not present

## 2017-04-20 DIAGNOSIS — Z800 Family history of malignant neoplasm of digestive organs: Secondary | ICD-10-CM | POA: Diagnosis not present

## 2017-04-20 DIAGNOSIS — I129 Hypertensive chronic kidney disease with stage 1 through stage 4 chronic kidney disease, or unspecified chronic kidney disease: Secondary | ICD-10-CM | POA: Insufficient documentation

## 2017-04-20 DIAGNOSIS — Z87440 Personal history of urinary (tract) infections: Secondary | ICD-10-CM | POA: Diagnosis not present

## 2017-04-20 DIAGNOSIS — Z170 Estrogen receptor positive status [ER+]: Secondary | ICD-10-CM | POA: Insufficient documentation

## 2017-04-20 NOTE — Consult Note (Signed)
NEW PATIENT EVALUATION  Name: Dana Harrell  MRN: [id number]  Date:   04/20/2017     DOB: [birth year]   This 51 y.o. female patient presents to the clinic for initial evaluation of stage I (T2 N0 M0) invasive mammary carcinoma of the left breast ER/PR positive HER-2/neu negative.  REFERRING PHYSICIAN: Alanson Aly, FNP  CHIEF COMPLAINT:  Chief Complaint  Patient presents with  . Breast Cancer    Initial Evaluation    DIAGNOSIS: There were no encounter diagnoses.   PREVIOUS INVESTIGATIONS:  Mammograms ultrasound reviewed Pathology reports reviewed Clinical notes reviewed  HPI: Patient is a 51 year old female presented with self discovered mass in her left breast. This mass was confirmed is a lobular mass in the upper outer quadrant of the left breast measuring approximate 2.5 x 2.1 cm on mammogram and 2 x 1.8 cm on ultrasound. Tumor was at the 3:00 position 4 cm from the nipple. No axillary adenopathy was identified. She underwent ultrasound-guided biopsy showing invasive mammary carcinoma. Tumor was strongly ER/PR PR positive HER-2/neu negative. She went on to have a wide local excision fora tube a 2.3 cm overall grade 2 invasive mammary carcinoma with margins clear at 3 mm. One sentinel lymph node was negative for metastatic disease. She's gone on to have an Oncotype DX testing which showed intermediate risk of 21. She's had conversations with medical oncology about the benefits and risks of adjuvant chemotherapy and that has been declined. She is seen today for radiation oncology opinion. She somewhat nervous and apprehensive although is doing well she specifically denies breast tenderness cough or bone pain.  PLANNED TREATMENT REGIMEN: Hypofractionated left whole breast radiation  PAST MEDICAL HISTORY:  has a past medical history of Anxiety; Cancer ([place]); Chronic kidney disease (2009); GERD (gastroesophageal reflux disease); History of kidney stones; Hypertension; Rhabdomyolysis;  and UTI (urinary tract infection).    PAST SURGICAL HISTORY:  Past Surgical History:  Procedure Laterality Date  . CHOLECYSTECTOMY    . COLONOSCOPY     4/17  . MASTECTOMY, PARTIAL Left 03/09/2017   Procedure: MASTECTOMY PARTIAL;  Surgeon: Leonie Green, MD;  Location: [facility] ORS;  Service: General;  Laterality: Left;  . MS MAMMO SCREENING     4/18  . OTHER SURGICAL HISTORY     pap 4/18  . SENTINEL NODE BIOPSY Left 03/09/2017   Procedure: SENTINEL NODE BIOPSY;  Surgeon: Leonie Green, MD;  Location: [facility] ORS;  Service: General;  Laterality: Left;    FAMILY HISTORY: family history includes Breast cancer in her maternal aunt, maternal aunt, and maternal aunt; Colon cancer (age of onset: 51) in her mother.  SOCIAL HISTORY:  reports that she has never smoked. She has never used smokeless tobacco. She reports that she drinks alcohol. She reports that she does not use drugs.  ALLERGIES: Lovastatin; Motrin [ibuprofen]; and Promethazine  MEDICATIONS:  Current Outpatient Prescriptions  Medication Sig Dispense Refill  . acetaminophen (TYLENOL) 500 MG tablet Take 1,000 mg by mouth every 6 (six) hours as needed.    Marland Kitchen amLODipine (NORVASC) 10 MG tablet Take 10 mg by mouth every morning.     Marland Kitchen HYDROcodone-acetaminophen (NORCO) 5-325 MG tablet Take 1-2 tablets by mouth every 4 (four) hours as needed for moderate pain. 12 tablet 0  . Omega-3 Fatty Acids (FISH OIL) 1000 MG CAPS Take 1,000 mg by mouth daily.    . RABEprazole (ACIPHEX) 20 MG tablet Take 20 mg by mouth daily.    . sertraline (ZOLOFT) 50 MG tablet  Take 50 mg by mouth every morning.      No current facility-administered medications for this encounter.     ECOG PERFORMANCE STATUS:  0 - Asymptomatic  REVIEW OF SYSTEMS:  Patient denies any weight loss, fatigue, weakness, fever, chills or night sweats. Patient denies any loss of vision, blurred vision. Patient denies any ringing  of the ears or hearing loss. No irregular  heartbeat. Patient denies heart murmur or history of fainting. Patient denies any chest pain or pain radiating to her upper extremities. Patient denies any shortness of breath, difficulty breathing at night, cough or hemoptysis. Patient denies any swelling in the lower legs. Patient denies any nausea vomiting, vomiting of blood, or coffee ground material in the vomitus. Patient denies any stomach pain. Patient states has had normal bowel movements no significant constipation or diarrhea. Patient denies any dysuria, hematuria or significant nocturia. Patient denies any problems walking, swelling in the joints or loss of balance. Patient denies any skin changes, loss of hair or loss of weight. Patient denies any excessive worrying or anxiety or significant depression. Patient denies any problems with insomnia. Patient denies excessive thirst, polyuria, polydipsia. Patient denies any swollen glands, patient denies easy bruising or easy bleeding. Patient denies any recent infections, allergies or URI. Patient "s visual fields have not changed significantly in recent time.    PHYSICAL EXAM: BP (!) 146/87   Pulse 81   Temp 97.6 F (36.4 C)   Wt 175 lb 9.5 oz (79.6 kg)   BMI 28.34 kg/m  She status post wide local excision of the left breast. Incision is healed well. No dominant mass or nodularity is noted in either breast in 2 positions examined. No axillary or supraclavicular adenopathy is appreciated. Well-developed well-nourished patient in NAD. HEENT reveals PERLA, EOMI, discs not visualized.  Oral cavity is clear. No oral mucosal lesions are identified. Neck is clear without evidence of cervical or supraclavicular adenopathy. Lungs are clear to A&P. Cardiac examination is essentially unremarkable with regular rate and rhythm without murmur rub or thrill. Abdomen is benign with no organomegaly or masses noted. Motor sensory and DTR levels are equal and symmetric in the upper and lower extremities. Cranial  nerves II through XII are grossly intact. Proprioception is intact. No peripheral adenopathy or edema is identified. No motor or sensory levels are noted. Crude visual fields are within normal range.  LABORATORY DATA: Pathology reports reviewed    RADIOLOGY RESULTS: Mammograms and ultrasound reviewed   IMPRESSION: Stage I ER/PR positive HER-2/neu negative invasive mammary carcinoma the left breast as was wide local excision and sentinel node biopsy  PLAN: At this time I to go ahead with whole breast radiation. I believe with her breast size we can safely do hypofractionated course over 4 weeks. Based on the 3 mm margin also boost her scar another 1600 cGy using electron beam. Risks and benefits of treatment including skin reaction fatigue alteration of blood counts possible inclusion of superficial lung all were discussed in detail with the patient. I personally set up and ordered CT simulation for early next week. Patient also will be candidate for antiestrogen therapy after completion of radiation. Patient seems to comprehend my treatment plan well.  I would like to take this opportunity to thank you for allowing me to participate in the care of your patient.Armstead Peaks., MD

## 2017-04-23 ENCOUNTER — Ambulatory Visit
Admission: RE | Admit: 2017-04-23 | Discharge: 2017-04-23 | Disposition: A | Discharge status: Home / Self Care | Payer: BLUE CROSS/BLUE SHIELD | Source: Ambulatory Visit | Attending: Radiation Oncology | Admitting: Radiation Oncology

## 2017-04-23 DIAGNOSIS — C50412 Malignant neoplasm of upper-outer quadrant of left female breast: Secondary | ICD-10-CM | POA: Diagnosis not present

## 2017-04-29 DIAGNOSIS — C50412 Malignant neoplasm of upper-outer quadrant of left female breast: Secondary | ICD-10-CM | POA: Diagnosis not present

## 2017-05-01 ENCOUNTER — Other Ambulatory Visit: Payer: Self-pay | Admitting: *Deleted

## 2017-05-01 DIAGNOSIS — C50412 Malignant neoplasm of upper-outer quadrant of left female breast: Secondary | ICD-10-CM

## 2017-05-02 ENCOUNTER — Ambulatory Visit
Admission: RE | Admit: 2017-05-02 | Discharge: 2017-05-02 | Disposition: A | Discharge status: Home / Self Care | Payer: BLUE CROSS/BLUE SHIELD | Source: Ambulatory Visit | Attending: Radiation Oncology | Admitting: Radiation Oncology

## 2017-05-02 DIAGNOSIS — C50412 Malignant neoplasm of upper-outer quadrant of left female breast: Secondary | ICD-10-CM | POA: Diagnosis not present

## 2017-05-03 ENCOUNTER — Ambulatory Visit
Admission: RE | Admit: 2017-05-03 | Discharge: 2017-05-03 | Disposition: A | Discharge status: Home / Self Care | Payer: BLUE CROSS/BLUE SHIELD | Source: Ambulatory Visit | Attending: Radiation Oncology | Admitting: Radiation Oncology

## 2017-05-03 DIAGNOSIS — C50412 Malignant neoplasm of upper-outer quadrant of left female breast: Secondary | ICD-10-CM | POA: Diagnosis not present

## 2017-05-04 ENCOUNTER — Ambulatory Visit
Admission: RE | Admit: 2017-05-04 | Discharge: 2017-05-04 | Disposition: A | Discharge status: Home / Self Care | Payer: BLUE CROSS/BLUE SHIELD | Source: Ambulatory Visit | Attending: Radiation Oncology | Admitting: Radiation Oncology

## 2017-05-04 DIAGNOSIS — C50412 Malignant neoplasm of upper-outer quadrant of left female breast: Secondary | ICD-10-CM | POA: Diagnosis not present

## 2017-05-07 ENCOUNTER — Ambulatory Visit
Admission: RE | Admit: 2017-05-07 | Discharge: 2017-05-07 | Disposition: A | Discharge status: Home / Self Care | Payer: BLUE CROSS/BLUE SHIELD | Source: Ambulatory Visit | Attending: Radiation Oncology | Admitting: Radiation Oncology

## 2017-05-07 DIAGNOSIS — C50412 Malignant neoplasm of upper-outer quadrant of left female breast: Secondary | ICD-10-CM | POA: Diagnosis not present

## 2017-05-08 ENCOUNTER — Ambulatory Visit
Admission: RE | Admit: 2017-05-08 | Discharge: 2017-05-08 | Disposition: A | Discharge status: Home / Self Care | Payer: BLUE CROSS/BLUE SHIELD | Source: Ambulatory Visit | Attending: Radiation Oncology | Admitting: Radiation Oncology

## 2017-05-08 ENCOUNTER — Inpatient Hospital Stay: Payer: BLUE CROSS/BLUE SHIELD | Admitting: Oncology

## 2017-05-08 DIAGNOSIS — C50412 Malignant neoplasm of upper-outer quadrant of left female breast: Secondary | ICD-10-CM | POA: Diagnosis not present

## 2017-05-08 NOTE — Progress Notes (Deleted)
Hematology/Oncology Consult note [facility]  [phone] [phone]  Patient Care Team: Alanson Aly, [place] as PCP - General (Family Medicine)   Name of the patient: Dana Harrell  [id number]  23-Mar-1966   Date of visit: 05/08/17  Diagnosis- ***  Chief complaint/ Reason for visit- ***  Heme/Onc history: ***  Interval history- ***  ECOG PS- *** Pain scale- *** Opioid associated constipation- ***  Review of systems- ROS   Current treatment- ***  Allergies  Allergen Reactions  . Lovastatin Other (See Comments)    Chest pain  . Motrin [Ibuprofen] Other (See Comments)    Hx of renal failure  . Promethazine Other (See Comments)    Excessive sleepiness.     Past Medical History:  Diagnosis Date  . Anxiety   . Cancer ([place])   . Chronic kidney disease 2009   H/O ACUTE KIDNEY FAILURE WITH RHABDOMYOLOSIS  . GERD (gastroesophageal reflux disease)   . History of kidney stones   . Hypertension   . Rhabdomyolysis    2009  . UTI (urinary tract infection)      Past Surgical History:  Procedure Laterality Date  . CHOLECYSTECTOMY    . COLONOSCOPY     4/17  . MASTECTOMY, PARTIAL Left 03/09/2017   Procedure: MASTECTOMY PARTIAL;  Surgeon: Leonie Green, MD;  Location: [facility] ORS;  Service: General;  Laterality: Left;  . MS MAMMO SCREENING     4/18  . OTHER SURGICAL HISTORY     pap 4/18  . SENTINEL NODE BIOPSY Left 03/09/2017   Procedure: SENTINEL NODE BIOPSY;  Surgeon: Leonie Green, MD;  Location: [facility] ORS;  Service: General;  Laterality: Left;    Social History   Social History  . Marital status: Married    Spouse name: N/A  . Number of children: N/A  . Years of education: N/A   Occupational History  . Not on file.   Social History Main Topics  . Smoking status: Never Smoker  . Smokeless tobacco: Never Used  . Alcohol use Yes     Comment: socially  . Drug use: No  . Sexual activity: Not on file     Other Topics Concern  . Not on file   Social History Narrative  . No narrative on file    Family History  Problem Relation Age of Onset  . Colon cancer Mother 25  . Breast cancer Maternal Aunt        3 materal aunts all 21's  . Breast cancer Maternal Aunt   . Breast cancer Maternal Aunt      Current Outpatient Prescriptions:  .  acetaminophen (TYLENOL) 500 MG tablet, Take 1,000 mg by mouth every 6 (six) hours as needed., Disp: , Rfl:  .  amLODipine (NORVASC) 10 MG tablet, Take 10 mg by mouth every morning. , Disp: , Rfl:  .  HYDROcodone-acetaminophen (NORCO) 5-325 MG tablet, Take 1-2 tablets by mouth every 4 (four) hours as needed for moderate pain., Disp: 12 tablet, Rfl: 0 .  Omega-3 Fatty Acids (FISH OIL) 1000 MG CAPS, Take 1,000 mg by mouth daily., Disp: , Rfl:  .  RABEprazole (ACIPHEX) 20 MG tablet, Take 20 mg by mouth daily., Disp: , Rfl:  .  sertraline (ZOLOFT) 50 MG tablet, Take 50 mg by mouth every morning. , Disp: , Rfl:   Physical exam: There were no vitals filed for this visit. Physical Exam   CMP Latest Ref Rng & Units 03/15/2017  Glucose 65 - 99 mg/dL 218(H)  BUN 6 - 20 mg/dL 13  Creatinine 0.44 - 1.00 mg/dL 0.73  Sodium 135 - 145 mmol/L 137  Potassium 3.5 - 5.1 mmol/L 3.5  Chloride 101 - 111 mmol/L 102  CO2 22 - 32 mmol/L 26  Calcium 8.9 - 10.3 mg/dL 9.5  Total Protein 6.5 - 8.1 g/dL 7.4  Total Bilirubin 0.3 - 1.2 mg/dL 0.6  Alkaline Phos 38 - 126 U/L 149(H)  AST 15 - 41 U/L 47(H)  ALT 14 - 54 U/L 42   CBC Latest Ref Rng & Units 03/15/2017  WBC 3.6 - 11.0 K/uL 8.1  Hemoglobin 12.0 - 16.0 g/dL 12.2  Hematocrit 35.0 - 47.0 % 35.4  Platelets 150 - 440 K/uL 218     Assessment and plan- Patient is a 51 y.o. female ***   Visit Diagnosis No diagnosis found.   Dr. Randa Evens, MD, MPH [place] at [facility] Pager- [phone] 05/08/2017 1:46 PM

## 2017-05-09 ENCOUNTER — Ambulatory Visit
Admission: RE | Admit: 2017-05-09 | Discharge: 2017-05-09 | Disposition: A | Discharge status: Home / Self Care | Payer: BLUE CROSS/BLUE SHIELD | Source: Ambulatory Visit | Attending: Radiation Oncology | Admitting: Radiation Oncology

## 2017-05-09 DIAGNOSIS — C50412 Malignant neoplasm of upper-outer quadrant of left female breast: Secondary | ICD-10-CM | POA: Diagnosis not present

## 2017-05-10 ENCOUNTER — Ambulatory Visit
Admission: RE | Admit: 2017-05-10 | Discharge: 2017-05-10 | Disposition: A | Discharge status: Home / Self Care | Payer: BLUE CROSS/BLUE SHIELD | Source: Ambulatory Visit | Attending: Radiation Oncology | Admitting: Radiation Oncology

## 2017-05-10 DIAGNOSIS — C50412 Malignant neoplasm of upper-outer quadrant of left female breast: Secondary | ICD-10-CM | POA: Diagnosis not present

## 2017-05-11 ENCOUNTER — Ambulatory Visit: Payer: BLUE CROSS/BLUE SHIELD

## 2017-05-11 ENCOUNTER — Ambulatory Visit: Admission: RE | Admit: 2017-05-11 | Payer: BLUE CROSS/BLUE SHIELD | Source: Ambulatory Visit

## 2017-05-14 ENCOUNTER — Ambulatory Visit
Admission: RE | Admit: 2017-05-14 | Discharge: 2017-05-14 | Disposition: A | Discharge status: Home / Self Care | Payer: BLUE CROSS/BLUE SHIELD | Source: Ambulatory Visit | Attending: Radiation Oncology | Admitting: Radiation Oncology

## 2017-05-14 DIAGNOSIS — C50412 Malignant neoplasm of upper-outer quadrant of left female breast: Secondary | ICD-10-CM | POA: Diagnosis not present

## 2017-05-15 ENCOUNTER — Ambulatory Visit
Admission: RE | Admit: 2017-05-15 | Discharge: 2017-05-15 | Disposition: A | Discharge status: Home / Self Care | Payer: BLUE CROSS/BLUE SHIELD | Source: Ambulatory Visit | Attending: Radiation Oncology | Admitting: Radiation Oncology

## 2017-05-15 DIAGNOSIS — C50412 Malignant neoplasm of upper-outer quadrant of left female breast: Secondary | ICD-10-CM | POA: Diagnosis not present

## 2017-05-16 ENCOUNTER — Ambulatory Visit
Admission: RE | Admit: 2017-05-16 | Discharge: 2017-05-16 | Disposition: A | Discharge status: Home / Self Care | Payer: BLUE CROSS/BLUE SHIELD | Source: Ambulatory Visit | Attending: Radiation Oncology | Admitting: Radiation Oncology

## 2017-05-16 ENCOUNTER — Inpatient Hospital Stay: Payer: BLUE CROSS/BLUE SHIELD | Attending: Radiation Oncology

## 2017-05-16 DIAGNOSIS — C50412 Malignant neoplasm of upper-outer quadrant of left female breast: Secondary | ICD-10-CM | POA: Diagnosis present

## 2017-05-16 DIAGNOSIS — Z170 Estrogen receptor positive status [ER+]: Secondary | ICD-10-CM | POA: Diagnosis not present

## 2017-05-16 LAB — CBC
HCT: 37.2 % (ref 35.0–47.0)
HEMOGLOBIN: 12.7 g/dL (ref 12.0–16.0)
MCH: 29.3 pg (ref 26.0–34.0)
MCHC: 34.2 g/dL (ref 32.0–36.0)
MCV: 85.6 fL (ref 80.0–100.0)
Platelets: 215 10*3/uL (ref 150–440)
RBC: 4.35 MIL/uL (ref 3.80–5.20)
RDW: 13.5 % (ref 11.5–14.5)
WBC: 5.3 10*3/uL (ref 3.6–11.0)

## 2017-05-17 ENCOUNTER — Ambulatory Visit
Admission: RE | Admit: 2017-05-17 | Discharge: 2017-05-17 | Disposition: A | Discharge status: Home / Self Care | Payer: BLUE CROSS/BLUE SHIELD | Source: Ambulatory Visit | Attending: Radiation Oncology | Admitting: Radiation Oncology

## 2017-05-17 DIAGNOSIS — C50412 Malignant neoplasm of upper-outer quadrant of left female breast: Secondary | ICD-10-CM | POA: Diagnosis not present

## 2017-05-18 ENCOUNTER — Ambulatory Visit
Admission: RE | Admit: 2017-05-18 | Discharge: 2017-05-18 | Disposition: A | Discharge status: Home / Self Care | Payer: BLUE CROSS/BLUE SHIELD | Source: Ambulatory Visit | Attending: Radiation Oncology | Admitting: Radiation Oncology

## 2017-05-18 ENCOUNTER — Ambulatory Visit: Payer: BLUE CROSS/BLUE SHIELD

## 2017-05-18 DIAGNOSIS — C50412 Malignant neoplasm of upper-outer quadrant of left female breast: Secondary | ICD-10-CM | POA: Diagnosis not present

## 2017-05-21 ENCOUNTER — Ambulatory Visit
Admission: RE | Admit: 2017-05-21 | Discharge: 2017-05-21 | Disposition: A | Discharge status: Home / Self Care | Payer: BLUE CROSS/BLUE SHIELD | Source: Ambulatory Visit | Attending: Radiation Oncology | Admitting: Radiation Oncology

## 2017-05-21 DIAGNOSIS — C50412 Malignant neoplasm of upper-outer quadrant of left female breast: Secondary | ICD-10-CM | POA: Diagnosis not present

## 2017-05-22 ENCOUNTER — Ambulatory Visit
Admission: RE | Admit: 2017-05-22 | Discharge: 2017-05-22 | Disposition: A | Discharge status: Home / Self Care | Payer: BLUE CROSS/BLUE SHIELD | Source: Ambulatory Visit | Attending: Radiation Oncology | Admitting: Radiation Oncology

## 2017-05-22 DIAGNOSIS — C50412 Malignant neoplasm of upper-outer quadrant of left female breast: Secondary | ICD-10-CM | POA: Diagnosis not present

## 2017-05-23 ENCOUNTER — Ambulatory Visit
Admission: RE | Admit: 2017-05-23 | Discharge: 2017-05-23 | Disposition: A | Discharge status: Home / Self Care | Payer: BLUE CROSS/BLUE SHIELD | Source: Ambulatory Visit | Attending: Radiation Oncology | Admitting: Radiation Oncology

## 2017-05-23 DIAGNOSIS — C50412 Malignant neoplasm of upper-outer quadrant of left female breast: Secondary | ICD-10-CM | POA: Diagnosis not present

## 2017-05-24 ENCOUNTER — Ambulatory Visit
Admission: RE | Admit: 2017-05-24 | Discharge: 2017-05-24 | Disposition: A | Discharge status: Home / Self Care | Payer: BLUE CROSS/BLUE SHIELD | Source: Ambulatory Visit | Attending: Radiation Oncology | Admitting: Radiation Oncology

## 2017-05-24 DIAGNOSIS — C50412 Malignant neoplasm of upper-outer quadrant of left female breast: Secondary | ICD-10-CM | POA: Diagnosis not present

## 2017-05-25 ENCOUNTER — Ambulatory Visit
Admission: RE | Admit: 2017-05-25 | Discharge: 2017-05-25 | Disposition: A | Discharge status: Home / Self Care | Payer: BLUE CROSS/BLUE SHIELD | Source: Ambulatory Visit | Attending: Radiation Oncology | Admitting: Radiation Oncology

## 2017-05-25 ENCOUNTER — Ambulatory Visit: Payer: BLUE CROSS/BLUE SHIELD

## 2017-05-25 DIAGNOSIS — C50412 Malignant neoplasm of upper-outer quadrant of left female breast: Secondary | ICD-10-CM | POA: Diagnosis not present

## 2017-05-28 ENCOUNTER — Ambulatory Visit
Admission: RE | Admit: 2017-05-28 | Discharge: 2017-05-28 | Disposition: A | Discharge status: Home / Self Care | Payer: BLUE CROSS/BLUE SHIELD | Source: Ambulatory Visit | Attending: Radiation Oncology | Admitting: Radiation Oncology

## 2017-05-28 DIAGNOSIS — C50412 Malignant neoplasm of upper-outer quadrant of left female breast: Secondary | ICD-10-CM | POA: Diagnosis not present

## 2017-05-29 ENCOUNTER — Ambulatory Visit
Admission: RE | Admit: 2017-05-29 | Discharge: 2017-05-29 | Disposition: A | Discharge status: Home / Self Care | Payer: BLUE CROSS/BLUE SHIELD | Source: Ambulatory Visit | Attending: Radiation Oncology | Admitting: Radiation Oncology

## 2017-05-29 DIAGNOSIS — C50412 Malignant neoplasm of upper-outer quadrant of left female breast: Secondary | ICD-10-CM | POA: Diagnosis not present

## 2017-05-30 ENCOUNTER — Inpatient Hospital Stay: Payer: BLUE CROSS/BLUE SHIELD

## 2017-05-30 ENCOUNTER — Ambulatory Visit
Admission: RE | Admit: 2017-05-30 | Discharge: 2017-05-30 | Disposition: A | Discharge status: Home / Self Care | Payer: BLUE CROSS/BLUE SHIELD | Source: Ambulatory Visit | Attending: Radiation Oncology | Admitting: Radiation Oncology

## 2017-05-30 DIAGNOSIS — C50412 Malignant neoplasm of upper-outer quadrant of left female breast: Secondary | ICD-10-CM | POA: Diagnosis not present

## 2017-05-30 LAB — CBC
HCT: 38.8 % (ref 35.0–47.0)
Hemoglobin: 13.4 g/dL (ref 12.0–16.0)
MCH: 29.8 pg (ref 26.0–34.0)
MCHC: 34.5 g/dL (ref 32.0–36.0)
MCV: 86.4 fL (ref 80.0–100.0)
PLATELETS: 205 10*3/uL (ref 150–440)
RBC: 4.49 MIL/uL (ref 3.80–5.20)
RDW: 13.3 % (ref 11.5–14.5)
WBC: 5.9 10*3/uL (ref 3.6–11.0)

## 2017-05-31 ENCOUNTER — Ambulatory Visit
Admission: RE | Admit: 2017-05-31 | Discharge: 2017-05-31 | Disposition: A | Discharge status: Home / Self Care | Payer: BLUE CROSS/BLUE SHIELD | Source: Ambulatory Visit | Attending: Radiation Oncology | Admitting: Radiation Oncology

## 2017-05-31 DIAGNOSIS — C50412 Malignant neoplasm of upper-outer quadrant of left female breast: Secondary | ICD-10-CM | POA: Diagnosis not present

## 2017-06-01 ENCOUNTER — Ambulatory Visit
Admission: RE | Admit: 2017-06-01 | Discharge: 2017-06-01 | Disposition: A | Discharge status: Home / Self Care | Payer: BLUE CROSS/BLUE SHIELD | Source: Ambulatory Visit | Attending: Radiation Oncology | Admitting: Radiation Oncology

## 2017-06-01 DIAGNOSIS — C50412 Malignant neoplasm of upper-outer quadrant of left female breast: Secondary | ICD-10-CM | POA: Diagnosis not present

## 2017-06-04 ENCOUNTER — Ambulatory Visit
Admission: RE | Admit: 2017-06-04 | Discharge: 2017-06-04 | Disposition: A | Discharge status: Home / Self Care | Payer: BLUE CROSS/BLUE SHIELD | Source: Ambulatory Visit | Attending: Radiation Oncology | Admitting: Radiation Oncology

## 2017-06-04 DIAGNOSIS — C50412 Malignant neoplasm of upper-outer quadrant of left female breast: Secondary | ICD-10-CM | POA: Diagnosis not present

## 2017-06-05 ENCOUNTER — Ambulatory Visit: Payer: BLUE CROSS/BLUE SHIELD

## 2017-06-05 ENCOUNTER — Ambulatory Visit
Admission: RE | Admit: 2017-06-05 | Discharge: 2017-06-05 | Disposition: A | Discharge status: Home / Self Care | Payer: BLUE CROSS/BLUE SHIELD | Source: Ambulatory Visit | Attending: Radiation Oncology | Admitting: Radiation Oncology

## 2017-06-05 DIAGNOSIS — C50412 Malignant neoplasm of upper-outer quadrant of left female breast: Secondary | ICD-10-CM | POA: Diagnosis not present

## 2017-06-06 ENCOUNTER — Ambulatory Visit: Payer: BLUE CROSS/BLUE SHIELD

## 2017-06-06 ENCOUNTER — Ambulatory Visit
Admission: RE | Admit: 2017-06-06 | Discharge: 2017-06-06 | Disposition: A | Discharge status: Home / Self Care | Payer: BLUE CROSS/BLUE SHIELD | Source: Ambulatory Visit | Attending: Radiation Oncology | Admitting: Radiation Oncology

## 2017-06-06 DIAGNOSIS — C50412 Malignant neoplasm of upper-outer quadrant of left female breast: Secondary | ICD-10-CM | POA: Diagnosis not present

## 2017-06-07 ENCOUNTER — Ambulatory Visit: Payer: BLUE CROSS/BLUE SHIELD

## 2017-06-18 ENCOUNTER — Telehealth: Payer: Self-pay | Admitting: *Deleted

## 2017-06-18 NOTE — Telephone Encounter (Signed)
Called pt back and gave her an appt 9/13 at 10:45 to start her on ai. Medication.

## 2017-06-20 ENCOUNTER — Encounter: Payer: Self-pay | Admitting: Oncology

## 2017-06-20 ENCOUNTER — Inpatient Hospital Stay: Payer: BLUE CROSS/BLUE SHIELD | Attending: Oncology | Admitting: Oncology

## 2017-06-20 VITALS — BP 148/84 | HR 94 | Temp 97.7°F | Resp 18 | Wt 177.1 lb

## 2017-06-20 DIAGNOSIS — N189 Chronic kidney disease, unspecified: Secondary | ICD-10-CM

## 2017-06-20 DIAGNOSIS — Z87440 Personal history of urinary (tract) infections: Secondary | ICD-10-CM | POA: Insufficient documentation

## 2017-06-20 DIAGNOSIS — C50412 Malignant neoplasm of upper-outer quadrant of left female breast: Secondary | ICD-10-CM | POA: Diagnosis not present

## 2017-06-20 DIAGNOSIS — Z800 Family history of malignant neoplasm of digestive organs: Secondary | ICD-10-CM | POA: Diagnosis not present

## 2017-06-20 DIAGNOSIS — Z9012 Acquired absence of left breast and nipple: Secondary | ICD-10-CM | POA: Diagnosis not present

## 2017-06-20 DIAGNOSIS — Z803 Family history of malignant neoplasm of breast: Secondary | ICD-10-CM | POA: Insufficient documentation

## 2017-06-20 DIAGNOSIS — I129 Hypertensive chronic kidney disease with stage 1 through stage 4 chronic kidney disease, or unspecified chronic kidney disease: Secondary | ICD-10-CM | POA: Insufficient documentation

## 2017-06-20 DIAGNOSIS — F419 Anxiety disorder, unspecified: Secondary | ICD-10-CM | POA: Diagnosis not present

## 2017-06-20 DIAGNOSIS — Z79818 Long term (current) use of other agents affecting estrogen receptors and estrogen levels: Secondary | ICD-10-CM | POA: Diagnosis not present

## 2017-06-20 DIAGNOSIS — Z79899 Other long term (current) drug therapy: Secondary | ICD-10-CM | POA: Insufficient documentation

## 2017-06-20 DIAGNOSIS — Z170 Estrogen receptor positive status [ER+]: Secondary | ICD-10-CM | POA: Insufficient documentation

## 2017-06-20 DIAGNOSIS — Z87442 Personal history of urinary calculi: Secondary | ICD-10-CM | POA: Diagnosis not present

## 2017-06-20 DIAGNOSIS — K219 Gastro-esophageal reflux disease without esophagitis: Secondary | ICD-10-CM | POA: Insufficient documentation

## 2017-06-20 MED ORDER — ANASTROZOLE 1 MG PO TABS: 1.0000 mg | ORAL_TABLET | Freq: Every day | ORAL | 3 refills | Status: DC

## 2017-06-20 NOTE — Progress Notes (Signed)
Here for follow up. Doing well she stated  

## 2017-06-20 NOTE — Progress Notes (Signed)
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 Patient is a 51 year old premenopausal female who noticed a palpable left breast mass in April 2018. Her prior mammogram was in 2015 which was normal. She has not had any personal history of breast cancer or prior breast biopsies. She does have a strong family history of breast cancer in her maternal aunts. She did undergo genetic testing for her treatment over area and cancer in the past and genetic testing showed ATM [id number] of uncertain significance   2. Recent mammogram on 02/02/2017 showed: There is a lobular mass within the upper-outer left breast underlying the palpable the marker. Mass measures 2.5 x 2.1 cm on mammography. No additional concerning masses, calcifications or nonsurgical distortion identified within either breast.  Mammographic images were processed with CAD.  On physical exam, I palpate a firm mass within the upper-outer left breast.  Targeted ultrasound is performed, showing a 2.0 x 1.8 x 1.6 cm irregular hypoechoic mass left breast 3 o'clock position 4 cm from nipple, corresponding with palpable abnormality. No left axillary adenopathy.  3. 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 ER PR positive her 2 negative   4. Patient is premenopausal.She is G5 P5 L5. Did not use birth control and did not breast feed. Menarche at the age of 51.  43. Patient underwent lumpectomy with SLNB  on 03/09/17 which showed: DIAGNOSIS:  A. LEFT BREAST MASS, 3:00; EXCISION:  - INVASIVE MAMMARY CARCINOMA OF NO SPECIAL TYPE.  - HISTOLOGIC CHANGES CONSISTENT WITH BIOPSY SITE.  - ATYPICAL LOBULAR HYPERPLASIA.  - THE SURGICAL MARGINS ARE NEGATIVE.   B. LEFT BREAST, MEDIAL MARGIN; REEXCISION:  - NEGATIVE FOR INVASIVE CARCINOMA.  - ATYPICAL LOBULAR HYPERPLASIA.  - FIBROCYSTIC CHANGE.   C. SENTINEL LYMPH NODE, LEFT AXILLA; EXCISION:  - NO TUMOR SEEN IN ONE LYMPH NODE (0/1).  - SEE SUMMARY BELOW.   Surgical Pathology Cancer Case Summary   INVASIVE CARCINOMA OF THE BREAST  Procedure: Lumpectomy  Specimen Laterality: Left  Histologic Type: Invasive carcinoma of no special type  Histologic Grade (Nottingham Histologic Score)       Glandular (Acinar)/Tubular Differentiation: 3       Nuclear Pleomorphism: 2       Mitotic Rate: 2       Overall Grade: 2  Tumor Size: 23 mm  Ductal Carcinoma in Situ (DCIS): Not identified  Lymphovascular Invasion: Not identified  Treatment Effect: Not applicable  Margins:       Invasive carcinoma: Uninvolved by invasive carcinoma         Distance from closest margin: 3 mm from posterior margin   Regional Lymph nodes:    Total # lymph nodes examined: 1    # Sentinel lymph nodes examined: 1    # Lymph nodes with macrometastasis (>2.0 mm): 0    # Lymph nodes with isolated tumor cells (<0.2 mm): 0    # Lymph nodes with micrometastasis (>0.2 mm and <2.0 mm): 0   Pathologic Stage Classification (pTNM, AJCC 7th Edition)  TNM Descriptors: Not applicable    pTNM: pT2 pN0(sn)   6. Oncotype testing showed intermediate score of 21. Risks and benefits of chemotherapy as well results of ASCO 2018 TAILORx plenary data discussed. Adjuvant chemotherapy not given after mutual discussion. She completed adjuvant radiation. Baseline bone density scan normal   Interval history- she has completed radiation.  Doing well. Denies any complaints. She still gets her periods but often it is just some spotting for few days  ECOG PS- 0 Pain scale- 0   Review of systems- Review of Systems  Constitutional: Negative for chills, fever, malaise/fatigue and weight loss.  HENT: Negative for congestion, ear discharge and nosebleeds.   Eyes: Negative for blurred vision.  Respiratory: Negative for cough, hemoptysis, sputum production, shortness of breath and wheezing.   Cardiovascular: Negative for chest pain, palpitations, orthopnea and claudication.  Gastrointestinal: Negative for abdominal pain, blood in stool, constipation, diarrhea, heartburn, melena, nausea and vomiting.  Genitourinary: Negative for dysuria, flank pain, frequency, hematuria and urgency.  Musculoskeletal: Negative for back pain, joint pain and myalgias.  Skin: Negative for rash.  Neurological: Negative for dizziness, tingling, focal weakness, seizures, weakness and headaches.  Endo/Heme/Allergies: Does not bruise/bleed easily.  Psychiatric/Behavioral: Negative for depression and suicidal ideas. The patient does not have insomnia.       Allergies  Allergen Reactions  . Lovastatin Other (See Comments)    Chest pain  . Motrin [Ibuprofen] Other (See Comments)    Hx of renal failure  . Promethazine Other (See Comments)    Excessive sleepiness.     Past Medical History:  Diagnosis Date  . Anxiety   . Cancer ([place])   . Chronic kidney disease 2009   H/O ACUTE KIDNEY FAILURE WITH RHABDOMYOLOSIS  . GERD (gastroesophageal reflux disease)   . History of kidney stones   . Hypertension   . Rhabdomyolysis    2009  . UTI (urinary tract infection)      Past Surgical History:  Procedure Laterality Date  . CHOLECYSTECTOMY    . COLONOSCOPY     4/17  . MASTECTOMY, PARTIAL Left 03/09/2017   Procedure: MASTECTOMY PARTIAL;  Surgeon: Leonie Green, MD;  Location: [facility] ORS;  Service: General;  Laterality: Left;  . MS MAMMO SCREENING      4/18  . OTHER SURGICAL HISTORY     pap 4/18  . SENTINEL NODE BIOPSY Left 03/09/2017   Procedure: SENTINEL NODE BIOPSY;  Surgeon: Leonie Green, MD;  Location: [facility] ORS;  Service: General;  Laterality: Left;    Social History   Social History  . Marital status: Married    Spouse name: N/A  . Number of children: N/A  . Years of education: N/A   Occupational History  . Not on file.   Social History Main Topics  . Smoking status: Never Smoker  . Smokeless tobacco: Never Used  . Alcohol use Yes     Comment: socially  . Drug use: No  . Sexual activity: Not on file   Other Topics Concern  . Not on file   Social History Narrative  . No narrative on file    Family History  Problem Relation Age of Onset  . Colon cancer Mother 44  . Breast cancer Maternal Aunt        3 materal aunts all 29's  . Breast cancer Maternal Aunt   . Breast cancer Maternal Aunt      Current Outpatient Prescriptions:  .  amLODipine (NORVASC) 10 MG tablet, Take  10 mg by mouth every morning. , Disp: , Rfl:  .  Omega-3 Fatty Acids (FISH OIL) 1000 MG CAPS, Take 1,000 mg by mouth daily., Disp: , Rfl:  .  RABEprazole (ACIPHEX) 20 MG tablet, Take 20 mg by mouth daily., Disp: , Rfl:  .  sertraline (ZOLOFT) 50 MG tablet, Take 50 mg by mouth every morning. , Disp: , Rfl:  .  acetaminophen (TYLENOL) 500 MG tablet, Take 1,000 mg by mouth every 6 (six) hours as needed., Disp: , Rfl:  .  anastrozole (ARIMIDEX) 1 MG tablet, Take 1 tablet (1 mg total) by mouth daily., Disp: 30 tablet, Rfl: 3 .  HYDROcodone-acetaminophen (NORCO) 5-325 MG tablet, Take 1-2 tablets by mouth every 4 (four) hours as needed for moderate pain. (Patient not taking: Reported on 06/20/2017), Disp: 12 tablet, Rfl: 0  Physical exam:  Vitals:   06/20/17 1351  BP: (!) 148/84  Pulse: 94  Resp: 18  Temp: 97.7 F (36.5 C)  TempSrc: Tympanic  Weight: 177 lb 1.6 oz (80.3 kg)   Physical Exam  Constitutional: She is oriented to person,  place, and time and well-developed, well-nourished, and in no distress.  HENT:  Head: Normocephalic and atraumatic.  Eyes: Pupils are equal, round, and reactive to light. EOM are normal.  Neck: Normal range of motion.  Cardiovascular: Normal rate, regular rhythm and normal heart sounds.   Pulmonary/Chest: Effort normal and breath sounds normal.  Abdominal: Soft. Bowel sounds are normal.  Neurological: She is alert and oriented to person, place, and time.  Skin: Skin is warm and dry.   Radiation changes noted over skin of left breast  CMP Latest Ref Rng & Units 03/15/2017  Glucose 65 - 99 mg/dL 218(H)  BUN 6 - 20 mg/dL 13  Creatinine 0.44 - 1.00 mg/dL 0.73  Sodium 135 - 145 mmol/L 137  Potassium 3.5 - 5.1 mmol/L 3.5  Chloride 101 - 111 mmol/L 102  CO2 22 - 32 mmol/L 26  Calcium 8.9 - 10.3 mg/dL 9.5  Total Protein 6.5 - 8.1 g/dL 7.4  Total Bilirubin 0.3 - 1.2 mg/dL 0.6  Alkaline Phos 38 - 126 U/L 149(H)  AST 15 - 41 U/L 47(H)  ALT 14 - 54 U/L 42   CBC Latest Ref Rng & Units 05/30/2017  WBC 3.6 - 11.0 K/uL 5.9  Hemoglobin 12.0 - 16.0 g/dL 13.4  Hematocrit 35.0 - 47.0 % 38.8  Platelets 150 - 440 K/uL 205     Assessment and plan- Patient is a 51 y.o. female stage IA pT2 N0 M0 invasive mammary carcinoma of the left breast ER/PR positive HER-2/neu negative s/p lumpectomy and adjuvant radiation  I discussed the tumor pathology and given that her tumor was ER PR positive and she still has menstrual cycles I would recommend hor him.mone therapy for 10 years. Discussed long term 8 yr DFS data of SOFT/TEXT trial published in [place] in July 2018. In premenopausal patients with early stage breast cancer who did not receive chemotherapy and in the intermediate risk group derived about a 5% benefit with AI plus OS versus tamoxifen alone. Patient is perimenopausal and may not have significant side effects from ovarian suppression.   Discussed risks and benefits of ovarian suppression as well as  aromatase inhibitors including all but not limited to hot flashes, mood swings, fatigue, arthralgias and worsening bone health. Also discussed the risks and benefits of tamoxifen including all but not limited to hot flashes, mood swings, risk of DVT and uterine cancer and  cataracts. Patient understands the risks and benefits of both AI plus OS versus tamoxifen and chooses to proceed with AI plus OS  Patient understands and agrees to proceed as planned. We will obtain insurance authorization and she will get her first Lupron shot next week 3.75 mg monthly. A week after her Lupron she will start taking Arimidex 1 mg by mouth daily. She will also take calcium 1200 mg along with vitamin D 800 international units daily. I will see her in 5 weeks from now with repeat CMP for second dose of lupron and assess her tolerance to AI   Total face to face encounter time for this patient visit was 30 min. >50% of the time was  spent in counseling and coordination of care.     Visit Diagnosis 1. Malignant neoplasm of upper-outer quadrant of left breast in female, estrogen receptor positive ([place])      Dr. Randa Evens, MD, MPH [place] at [facility] Pager- [phone] 06/20/2017 2:50 PM

## 2017-06-21 ENCOUNTER — Inpatient Hospital Stay: Payer: BLUE CROSS/BLUE SHIELD | Admitting: Oncology

## 2017-06-27 ENCOUNTER — Inpatient Hospital Stay: Payer: BLUE CROSS/BLUE SHIELD

## 2017-06-27 DIAGNOSIS — C50412 Malignant neoplasm of upper-outer quadrant of left female breast: Secondary | ICD-10-CM | POA: Diagnosis not present

## 2017-06-27 DIAGNOSIS — Z170 Estrogen receptor positive status [ER+]: Principal | ICD-10-CM

## 2017-06-27 MED ORDER — LEUPROLIDE ACETATE 3.75 MG IM KIT
3.7500 mg | PACK | INTRAMUSCULAR | Status: DC
  Administered 2017-06-27: 3.75 mg via INTRAMUSCULAR
  Filled 2017-06-27: qty 3.75

## 2017-07-04 ENCOUNTER — Ambulatory Visit
Admission: RE | Admit: 2017-07-04 | Discharge: 2017-07-04 | Disposition: A | Discharge status: Home / Self Care | Payer: BLUE CROSS/BLUE SHIELD | Source: Ambulatory Visit | Attending: Radiation Oncology | Admitting: Radiation Oncology

## 2017-07-04 ENCOUNTER — Other Ambulatory Visit: Payer: Self-pay | Admitting: *Deleted

## 2017-07-04 ENCOUNTER — Encounter: Payer: Self-pay | Admitting: Radiation Oncology

## 2017-07-04 VITALS — BP 127/81 | HR 76 | Temp 98.6°F | Resp 20 | Wt 173.7 lb

## 2017-07-04 DIAGNOSIS — Z170 Estrogen receptor positive status [ER+]: Secondary | ICD-10-CM | POA: Diagnosis not present

## 2017-07-04 DIAGNOSIS — C50412 Malignant neoplasm of upper-outer quadrant of left female breast: Secondary | ICD-10-CM | POA: Diagnosis not present

## 2017-07-04 DIAGNOSIS — Z79811 Long term (current) use of aromatase inhibitors: Secondary | ICD-10-CM | POA: Insufficient documentation

## 2017-07-04 DIAGNOSIS — Z923 Personal history of irradiation: Secondary | ICD-10-CM | POA: Insufficient documentation

## 2017-07-04 NOTE — Progress Notes (Signed)
Radiation Oncology Follow up Note  Name: Dana Harrell   Date:   07/04/2017 MRN:  [id number] DOB: [birth year]    This 51 y.o. female presents to the clinic today for one-month follow-up status post whole breast radiation to her left breast for ER/PR positive.HER-2 negative stage I invasive mammary carcinoma.  REFERRING PROVIDER: Alanson Aly, FNP  HPI: patient is a 51 year old female now out 1 month having completed whole breast radiation to her left breast for stage I (T2 N0 M0) ER/PR positive HER-2/neu negative invasive mammary carcinoma. Seen today in routine follow-up she is doing well. She specifically denies breast tenderness cough or bone pain..she is currently on arimadex tolerating that well without side effect  COMPLICATIONS OF TREATMENT: none  FOLLOW UP COMPLIANCE: keeps appointments   PHYSICAL EXAM:  BP 127/81   Pulse 76   Temp 98.6 F (37 C)   Resp 20   Wt 173 lb 11.6 oz (78.8 kg)   BMI 28.04 kg/m  Lungs are clear to A&P cardiac examination essentially unremarkable with regular rate and rhythm. No dominant mass or nodularity is noted in either breast in 2 positions examined. Incision is well-healed. No axillary or supraclavicular adenopathy is appreciated. Cosmetic result is excellent. Well-developed well-nourished patient in NAD. HEENT reveals PERLA, EOMI, discs not visualized.  Oral cavity is clear. No oral mucosal lesions are identified. Neck is clear without evidence of cervical or supraclavicular adenopathy. Lungs are clear to A&P. Cardiac examination is essentially unremarkable with regular rate and rhythm without murmur rub or thrill. Abdomen is benign with no organomegaly or masses noted. Motor sensory and DTR levels are equal and symmetric in the upper and lower extremities. Cranial nerves II through XII are grossly intact. Proprioception is intact. No peripheral adenopathy or edema is identified. No motor or sensory levels are noted. Crude visual fields are within  normal range.  RADIOLOGY RESULTS: no current films for review  PLAN: present time patient is doing well 1 month out from whole breast radiation. I'm please were overall progress. She is on anti-estrogen therapy and tolerating that well without side effect. I have asked to see her back in 4-5 months for follow-up. Patient knows to call sooner with any concerns at any time.  I would like to take this opportunity to thank you for allowing me to participate in the care of your patient.Armstead Peaks., MD

## 2017-07-12 ENCOUNTER — Telehealth: Payer: Self-pay | Admitting: *Deleted

## 2017-07-12 NOTE — Telephone Encounter (Signed)
Pt called stating that pt has had a HA for a week and she tried  advil and she is not suppose to take it but she did and it did not make it better.  She also took excedrin and it did not help either.  She also started her perioid on Monday of this week and it is still going strong she states.  She would like something to help the HA. I called and spoke to Dr. Janese Banks and she said for pt to stop the anastrozole and see if it is better and try it for up to 10 days. I asked her to call me back either Friday or Monday and give me an update

## 2017-07-17 ENCOUNTER — Telehealth: Payer: Self-pay | Admitting: *Deleted

## 2017-07-17 ENCOUNTER — Other Ambulatory Visit: Payer: Self-pay | Admitting: *Deleted

## 2017-07-17 NOTE — Telephone Encounter (Signed)
Spoke with patient and told her to continue to hold Arimidex and made appointment for next Tuesday to see Dr. Janese Banks per Dr. Elroy Channel request.  Patient verbalized understanding.

## 2017-07-17 NOTE — Telephone Encounter (Signed)
Ask her to hold arimidex for now. I will see her next week on Tuesday. Please add her to my schedule

## 2017-07-17 NOTE — Telephone Encounter (Signed)
Spoke to patient via telephone about her headaches. She stated that she stopped the Arimidex on Friday, and the headaches continued. She started back on the Arimidex this morning because she knows she needs to be on something. Will consult with Dr. Janese Banks and call her back today.

## 2017-07-24 ENCOUNTER — Encounter: Payer: Self-pay | Admitting: Oncology

## 2017-07-24 ENCOUNTER — Inpatient Hospital Stay: Payer: BLUE CROSS/BLUE SHIELD | Attending: Oncology | Admitting: Oncology

## 2017-07-24 VITALS — BP 133/81 | HR 80 | Temp 98.1°F | Resp 14 | Wt 168.0 lb

## 2017-07-24 DIAGNOSIS — F419 Anxiety disorder, unspecified: Secondary | ICD-10-CM

## 2017-07-24 DIAGNOSIS — C50412 Malignant neoplasm of upper-outer quadrant of left female breast: Secondary | ICD-10-CM | POA: Diagnosis not present

## 2017-07-24 DIAGNOSIS — Z79810 Long term (current) use of selective estrogen receptor modulators (SERMs): Secondary | ICD-10-CM | POA: Diagnosis not present

## 2017-07-24 DIAGNOSIS — K219 Gastro-esophageal reflux disease without esophagitis: Secondary | ICD-10-CM

## 2017-07-24 DIAGNOSIS — Z87442 Personal history of urinary calculi: Secondary | ICD-10-CM | POA: Insufficient documentation

## 2017-07-24 DIAGNOSIS — Z79899 Other long term (current) drug therapy: Secondary | ICD-10-CM | POA: Insufficient documentation

## 2017-07-24 DIAGNOSIS — Z170 Estrogen receptor positive status [ER+]: Secondary | ICD-10-CM | POA: Insufficient documentation

## 2017-07-24 DIAGNOSIS — I10 Essential (primary) hypertension: Secondary | ICD-10-CM | POA: Insufficient documentation

## 2017-07-24 DIAGNOSIS — Z803 Family history of malignant neoplasm of breast: Secondary | ICD-10-CM | POA: Diagnosis not present

## 2017-07-24 DIAGNOSIS — R519 Headache, unspecified: Secondary | ICD-10-CM

## 2017-07-24 DIAGNOSIS — Z800 Family history of malignant neoplasm of digestive organs: Secondary | ICD-10-CM | POA: Diagnosis not present

## 2017-07-24 DIAGNOSIS — R51 Headache: Secondary | ICD-10-CM | POA: Diagnosis not present

## 2017-07-24 DIAGNOSIS — Z87448 Personal history of other diseases of urinary system: Secondary | ICD-10-CM | POA: Insufficient documentation

## 2017-07-24 MED ORDER — TAMOXIFEN CITRATE 20 MG PO TABS: 20.0000 mg | ORAL_TABLET | Freq: Every day | ORAL | 3 refills | Status: DC

## 2017-07-24 NOTE — Progress Notes (Signed)
Patient stopped the Arimidex per Dr. Elroy Channel request, and is still having the headaches. She describes them as  squeezing headaches, and something tight around her head. She notices some improvement when she drinks highly caffienated beverages. She states that she does have a very distant history of migraine headaches with her monthly periods, but has not had them in many years. She does not take the Flu Vaccine.

## 2017-07-24 NOTE — Progress Notes (Signed)
Hematology/Oncology Consult note [name]  [phone] [phone]  Patient Care Team: Alanson Aly, [place] as PCP - General (Family Medicine)   Name of the patient: Dana Harrell  [id number]  Jan 14, 1966   Date of visit: 07/24/17  Diagnosis- stage IA pT2 N0 M0 invasive mammary carcinoma of the left breast ER/PR positive HER-2/neu negative   Chief complaint/ Reason for visit- discuss hormone therapy results  Heme/Onc history:1. Patient is a 51 year old premenopausal female who noticed a palpable left breast mass in April 2018. Her prior mammogram was in 2015 which was normal. She has not had any personal history of breast cancer or prior breast biopsies. She does have a strong family history of breast cancer in her maternal aunts. She did undergo genetic testing for her treatment over area and cancer in the past and genetic testing showed ATM [id number] of uncertain significance   2. Recent mammogram on 02/02/2017 showed: There is a lobular mass within the upper-outer left breast underlying the palpable the marker. Mass measures 2.5 x 2.1 cm on mammography. No additional concerning masses, calcifications or nonsurgical distortion identified within either breast.  Mammographic images were processed with CAD.  On physical exam, I palpate a firm mass within the upper-outer left breast.  Targeted ultrasound is performed, showing a 2.0 x 1.8 x 1.6 cm irregular hypoechoic mass left breast 3 o'clock position 4 cm from nipple, corresponding with palpable abnormality. No left axillary adenopathy.  3. 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 ER PR positive her 2 negative   4. Patient is premenopausal.She is G5 P5 L5. Did not use birth control and did not breast feed. Menarche at the age of 21.  46. Patient underwent lumpectomy with SLNB  on 03/09/17 which showed: DIAGNOSIS:  A. LEFT BREAST MASS, 3:00; EXCISION:  - INVASIVE MAMMARY CARCINOMA OF NO SPECIAL TYPE.  - HISTOLOGIC CHANGES CONSISTENT WITH BIOPSY SITE.  - ATYPICAL LOBULAR HYPERPLASIA.  - THE SURGICAL MARGINS ARE NEGATIVE.   B. LEFT BREAST, MEDIAL MARGIN; REEXCISION:  - NEGATIVE FOR INVASIVE CARCINOMA.  - ATYPICAL LOBULAR HYPERPLASIA.  - FIBROCYSTIC CHANGE.   C. SENTINEL LYMPH NODE, LEFT AXILLA; EXCISION:  - NO TUMOR SEEN IN ONE LYMPH NODE (0/1).  - SEE SUMMARY BELOW.   Surgical Pathology Cancer Case Summary   INVASIVE CARCINOMA OF THE BREAST  Procedure: Lumpectomy  Specimen Laterality: Left  Histologic Type: Invasive carcinoma of no special type  Histologic Grade (Nottingham Histologic Score)       Glandular (Acinar)/Tubular Differentiation: 3       Nuclear Pleomorphism: 2       Mitotic Rate: 2       Overall Grade: 2  Tumor Size: 23 mm  Ductal Carcinoma in Situ (DCIS): Not identified  Lymphovascular Invasion: Not identified  Treatment Effect: Not applicable  Margins:       Invasive carcinoma: Uninvolved by invasive carcinoma         Distance from closest margin: 3 mm from posterior margin   Regional Lymph nodes:    Total # lymph nodes examined: 1    # Sentinel lymph nodes examined: 1    # Lymph nodes with macrometastasis (>2.0 mm): 0    # Lymph nodes with isolated tumor cells (<0.2 mm): 0    # Lymph nodes with micrometastasis (>0.2 mm and <2.0 mm): 0   Pathologic Stage Classification (pTNM, AJCC 7th Edition)  TNM Descriptors: Not applicable    pTNM: pT2 pN0(sn)   6. Oncotype testing showed intermediate score of 21. Risks and benefits of chemotherapy as well results of ASCO 2018 TAILORx plenary data discussed. Adjuvant chemotherapy not given after mutual discussion. She completed adjuvant radiation. Baseline bone density scan normal  7. Patient started on lupron + AI in sept  2018  Interval history- Patient reports daily headaches since she started lupron plus AI. They did improve mildly after stopping AI but still occur daily and interfere with ehr QOL. Tylenol doesn't help and high dose caffeinated drinks somewhat alleviate it. It is making it difficult for her to work  ECOG PS- 0 Pain scale- 4- headaches  Review of systems- Review of Systems  Constitutional: Negative for chills, fever, malaise/fatigue and weight loss.  HENT: Negative for congestion, ear discharge and nosebleeds.   Eyes: Negative for blurred vision.  Respiratory: Negative for cough, hemoptysis, sputum production, shortness of breath and wheezing.   Cardiovascular: Negative for chest pain, palpitations, orthopnea and claudication.  Gastrointestinal: Negative for abdominal pain, blood in stool, constipation, diarrhea, heartburn, melena, nausea and vomiting.  Genitourinary: Negative for dysuria, flank pain, frequency, hematuria and urgency.  Musculoskeletal: Negative for back pain, joint pain and myalgias.  Skin: Negative for rash.  Neurological: Positive for headaches. Negative for dizziness, tingling, focal weakness, seizures and weakness.  Endo/Heme/Allergies: Does not bruise/bleed easily.  Psychiatric/Behavioral: Negative for depression and suicidal ideas. The patient does not have insomnia.       Allergies  Allergen Reactions  . Lovastatin Other (See Comments)    Chest pain  . Motrin [Ibuprofen] Other (See Comments)    Hx of renal failure  . Promethazine Other (See Comments)    Excessive sleepiness.     Past Medical History:  Diagnosis Date  . Anxiety   . Cancer ([place])   . Chronic kidney disease 2009   H/O ACUTE KIDNEY FAILURE WITH RHABDOMYOLOSIS  . GERD (gastroesophageal reflux disease)   . History of kidney stones   . Hypertension   . Rhabdomyolysis    2009  . UTI (urinary tract infection)      Past Surgical History:  Procedure Laterality Date  . CHOLECYSTECTOMY     . COLONOSCOPY     4/17  . MASTECTOMY, PARTIAL Left 03/09/2017   Procedure: MASTECTOMY PARTIAL;  Surgeon: Leonie Green, MD;  Location: [facility] ORS;  Service: General;  Laterality: Left;  . MS MAMMO SCREENING     4/18  . OTHER SURGICAL HISTORY     pap 4/18  . SENTINEL NODE BIOPSY Left 03/09/2017   Procedure: SENTINEL NODE BIOPSY;  Surgeon: Leonie Green, MD;  Location: [facility] ORS;  Service: General;  Laterality: Left;    Social History   Social History  . Marital status: Married    Spouse name: N/A  . Number of children: N/A  . Years of education: N/A   Occupational History  . Not on file.   Social History Main Topics  . Smoking status: Never Smoker  . Smokeless tobacco: Never Used  . Alcohol use Yes     Comment: socially  . Drug use: No  . Sexual activity: Not on file   Other Topics Concern  . Not on file   Social History Narrative  . No narrative on file    Family History  Problem Relation Age of Onset  . Colon cancer Mother 107  . Breast cancer Maternal Aunt        3  materal aunts all 84's  . Breast cancer Maternal Aunt   . Breast cancer Maternal Aunt      Current Outpatient Prescriptions:  .  acetaminophen (TYLENOL) 500 MG tablet, Take 1,000 mg by mouth every 6 (six) hours as needed., Disp: , Rfl:  .  amLODipine (NORVASC) 10 MG tablet, Take 10 mg by mouth every morning. , Disp: , Rfl:  .  Omega-3 Fatty Acids (FISH OIL) 1000 MG CAPS, Take 1,000 mg by mouth daily., Disp: , Rfl:  .  RABEprazole (ACIPHEX) 20 MG tablet, Take 20 mg by mouth daily., Disp: , Rfl:  .  sertraline (ZOLOFT) 50 MG tablet, Take 50 mg by mouth every morning. , Disp: , Rfl:  .  anastrozole (ARIMIDEX) 1 MG tablet, Take 1 tablet (1 mg total) by mouth daily. (Patient not taking: Reported on 07/24/2017), Disp: 30 tablet, Rfl: 3 .  HYDROcodone-acetaminophen (NORCO) 5-325 MG tablet, Take 1-2 tablets by mouth every 4 (four) hours as needed for moderate pain. (Patient not taking: Reported on  06/20/2017), Disp: 12 tablet, Rfl: 0 .  tamoxifen (NOLVADEX) 20 MG tablet, Take 1 tablet (20 mg total) by mouth daily., Disp: 30 tablet, Rfl: 3  Physical exam:  Vitals:   07/24/17 1021  BP: 133/81  Pulse: 80  Resp: 14  Temp: 98.1 F (36.7 C)  TempSrc: Tympanic  Weight: 168 lb (76.2 kg)   Physical Exam  Constitutional: She is oriented to person, place, and time and well-developed, well-nourished, and in no distress.  HENT:  Head: Normocephalic and atraumatic.  Eyes: Pupils are equal, round, and reactive to light. EOM are normal.  Neck: Normal range of motion.  Cardiovascular: Normal rate, regular rhythm and normal heart sounds.   Pulmonary/Chest: Effort normal and breath sounds normal.  Abdominal: Soft. Bowel sounds are normal.  Neurological: She is alert and oriented to person, place, and time.  Skin: Skin is warm and dry.     CMP Latest Ref Rng & Units 03/15/2017  Glucose 65 - 99 mg/dL 218(H)  BUN 6 - 20 mg/dL 13  Creatinine 0.44 - 1.00 mg/dL 0.73  Sodium 135 - 145 mmol/L 137  Potassium 3.5 - 5.1 mmol/L 3.5  Chloride 101 - 111 mmol/L 102  CO2 22 - 32 mmol/L 26  Calcium 8.9 - 10.3 mg/dL 9.5  Total Protein 6.5 - 8.1 g/dL 7.4  Total Bilirubin 0.3 - 1.2 mg/dL 0.6  Alkaline Phos 38 - 126 U/L 149(H)  AST 15 - 41 U/L 47(H)  ALT 14 - 54 U/L 42   CBC Latest Ref Rng & Units 05/30/2017  WBC 3.6 - 11.0 K/uL 5.9  Hemoglobin 12.0 - 16.0 g/dL 13.4  Hematocrit 35.0 - 47.0 % 38.8  Platelets 150 - 440 K/uL 205     Assessment and plan- Patient is a 51 y.o. female stage IA pT2 N0 M0 invasive mammary carcinoma of the left breast ER/PR positive HER-2/neu negative s/p lumpectomy and adjuvant radiation  lupron can cause headaches and her last dose was 3 weeks ago. Since her headaches are severe enough to affect her QOL, I will plan to hold lupron plus AI at this time. She is perimenopausal and I will therefore switch her to tamoxifen today. Discussed risks and benefits of tamoxifen including  all but not limited to fatigue, mood swings, hot flashes, risk of DVT, cataracts and risk of uterine cancer. Patient understands and agrees to proceed. She will continue to need yearly GYN and eye exams. I will see her back in  1 month's time with CMP and see how she is doing with tamoxifen and if her headaches improve.  If headaches persist despite stopping the Lupron plus AI I will consider referring her to neurology for further evaluation  Patient is currently on Zoloft and I did check drug interaction on lexicomp which does not show any significant interaction of tamoxifen and Zoloft   Visit Diagnosis 1. Malignant neoplasm of upper-outer quadrant of left breast in female, estrogen receptor positive ([place])   2. Acute nonintractable headache, unspecified headache type      Dr. Randa Evens, MD, MPH [facility] at [facility] Pager- [phone] 07/24/2017 1:33 PM

## 2017-08-02 ENCOUNTER — Ambulatory Visit: Payer: BLUE CROSS/BLUE SHIELD | Admitting: Oncology

## 2017-08-02 ENCOUNTER — Ambulatory Visit: Payer: BLUE CROSS/BLUE SHIELD

## 2017-08-02 ENCOUNTER — Other Ambulatory Visit: Payer: BLUE CROSS/BLUE SHIELD

## 2017-08-20 NOTE — Progress Notes (Signed)
Hematology/Oncology Consult note [facility]  Telephone:([phone] [phone]  Patient Care Team: Alanson Aly, [place] as PCP - General (Family Medicine)   Name of the patient: Dana Harrell  [id number]  March 31, 1966   Date of visit: 08/20/17  Diagnosis- stage IA pT2 N0 M0 invasive mammary carcinoma of the left breast ER/PR positive HER-2/neu negative    Chief complaint/ Reason for visit- f/u of ongoing symptoms of headache  Heme/Onc history: 1. Patient is a 51 year old premenopausal female who noticed a palpable left breast mass in April 2018. Her prior mammogram was in 2015 which was normal. She has not had any personal history of breast cancer or prior breast biopsies. She does have a strong family history of breast cancer in her maternal aunts. She did undergo genetic testing for her treatment over area and cancer in the past and genetic testing showed ATM [id number] of uncertain significance   2. Recent mammogram on 02/02/2017 showed: There is a lobular mass within the upper-outer left breast underlying the palpable the marker. Mass measures 2.5 x 2.1 cm on mammography. No additional concerning masses, calcifications or nonsurgical distortion identified within either breast.  Mammographic images were processed with CAD.  On physical exam, I palpate a firm mass within the upper-outer left breast.  Targeted ultrasound is performed, showing a 2.0 x 1.8 x 1.6 cm irregular hypoechoic mass left breast 3 o'clock position 4 cm from nipple, corresponding with palpable abnormality. No left axillary adenopathy.  3. 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 ER PR positive her 2 negative   4. Patient is premenopausal.She is G5 P5 L5. Did not use birth control and did not breast feed. Menarche at the age of 45.  23. Patient underwent lumpectomy with  SLNB on 03/09/17 which showed: DIAGNOSIS:  A. LEFT BREAST MASS, 3:00; EXCISION:  - INVASIVE MAMMARY CARCINOMA OF NO SPECIAL TYPE.  - HISTOLOGIC CHANGES CONSISTENT WITH BIOPSY SITE.  - ATYPICAL LOBULAR HYPERPLASIA.  - THE SURGICAL MARGINS ARE NEGATIVE.   B. LEFT BREAST, MEDIAL MARGIN; REEXCISION:  - NEGATIVE FOR INVASIVE CARCINOMA.  - ATYPICAL LOBULAR HYPERPLASIA.  - FIBROCYSTIC CHANGE.   C. SENTINEL LYMPH NODE, LEFT AXILLA; EXCISION:  - NO TUMOR SEEN IN ONE LYMPH NODE (0/1).  - SEE SUMMARY BELOW.   Surgical Pathology Cancer Case Summary   INVASIVE CARCINOMA OF THE BREAST  Procedure: Lumpectomy  Specimen Laterality: Left  Histologic Type: Invasive carcinoma of no special type  Histologic Grade (Nottingham Histologic Score)       Glandular (Acinar)/Tubular Differentiation: 3       Nuclear Pleomorphism: 2       Mitotic Rate: 2       Overall Grade: 2  Tumor Size: 23 mm  Ductal Carcinoma in Situ (DCIS): Not identified  Lymphovascular Invasion: Not identified  Treatment Effect: Not applicable  Margins:       Invasive carcinoma: Uninvolved by invasive carcinoma         Distance from closest margin: 3 mm from posterior margin   Regional Lymph nodes:    Total # lymph nodes examined: 1    # Sentinel lymph nodes examined: 1    # Lymph nodes with macrometastasis (>2.0 mm): 0    # Lymph nodes with isolated tumor cells (<0.2 mm): 0    # Lymph nodes with micrometastasis (>0.2 mm and <2.0 mm): 0   Pathologic Stage Classification (pTNM,  AJCC 7th Edition)    TNM Descriptors: Not applicable    pTNM: pT2 pN0(sn)   6. Oncotype testing showed intermediate score of 21. Risks and benefits of chemotherapy as well results of ASCO 2018 TAILORx plenary data discussed. Adjuvant chemotherapy not given after mutual discussion. She completed adjuvant radiation. Baseline bone density scan normal  7. Patient started on lupron + AI in sept  2018. She had significant headaches after a few weeks of lupron and AI. AI was stopped for 2 weeks but headaches did not resolve. Patient was therefore switched to tamoxifen     Interval history- headaches have improved remarkably after stopping lupron and arimidex. She has not had any menstrual cycles since sept 2018. She is tolerating tmaoxifen well without significant side effects  ECOG PS- 0 Pain scale- 0  Review of systems- Review of Systems  Constitutional: Negative for chills, fever, malaise/fatigue and weight loss.  HENT: Negative for congestion, ear discharge and nosebleeds.   Eyes: Negative for blurred vision.  Respiratory: Negative for cough, hemoptysis, sputum production, shortness of breath and wheezing.   Cardiovascular: Negative for chest pain, palpitations, orthopnea and claudication.  Gastrointestinal: Negative for abdominal pain, blood in stool, constipation, diarrhea, heartburn, melena, nausea and vomiting.  Genitourinary: Negative for dysuria, flank pain, frequency, hematuria and urgency.  Musculoskeletal: Negative for back pain, joint pain and myalgias.  Skin: Negative for rash.  Neurological: Negative for dizziness, tingling, focal weakness, seizures, weakness and headaches.  Endo/Heme/Allergies: Does not bruise/bleed easily.  Psychiatric/Behavioral: Negative for depression and suicidal ideas. The patient does not have insomnia.       Allergies  Allergen Reactions  . Lovastatin Other (See Comments)    Chest pain  . Motrin [Ibuprofen] Other (See Comments)    Hx of renal failure  . Promethazine Other (See Comments)    Excessive sleepiness.     Past Medical History:  Diagnosis Date  . Anxiety   . Cancer ([place])   . Chronic kidney disease 2009   H/O ACUTE KIDNEY FAILURE WITH RHABDOMYOLOSIS  . GERD (gastroesophageal reflux disease)   . History of kidney stones   . Hypertension   . Rhabdomyolysis    2009  . UTI (urinary tract infection)      Past  Surgical History:  Procedure Laterality Date  . CHOLECYSTECTOMY    . COLONOSCOPY     4/17  . MS MAMMO SCREENING     4/18  . OTHER SURGICAL HISTORY     pap 4/18    Social History   Socioeconomic History  . Marital status: Married    Spouse name: Not on file  . Number of children: Not on file  . Years of education: Not on file  . Highest education level: Not on file  Social Needs  . Financial resource strain: Not on file  . Food insecurity - worry: Not on file  . Food insecurity - inability: Not on file  . Transportation needs - medical: Not on file  . Transportation needs - non-medical: Not on file  Occupational History  . Not on file  Tobacco Use  . Smoking status: Never Smoker  . Smokeless tobacco: Never Used  Substance and Sexual Activity  . Alcohol use: Yes    Comment: socially  . Drug use: No  . Sexual activity: Not on file  Other Topics Concern  . Not on file  Social History Narrative  . Not on file    Family History  Problem Relation Age of Onset  .  Colon cancer Mother 52  . Breast cancer Maternal Aunt        3 materal aunts all 86's  . Breast cancer Maternal Aunt   . Breast cancer Maternal Aunt      Current Outpatient Medications:  .  acetaminophen (TYLENOL) 500 MG tablet, Take 1,000 mg by mouth every 6 (six) hours as needed., Disp: , Rfl:  .  amLODipine (NORVASC) 10 MG tablet, Take 10 mg by mouth every morning. , Disp: , Rfl:  .  anastrozole (ARIMIDEX) 1 MG tablet, Take 1 tablet (1 mg total) by mouth daily. (Patient not taking: Reported on 07/24/2017), Disp: 30 tablet, Rfl: 3 .  HYDROcodone-acetaminophen (NORCO) 5-325 MG tablet, Take 1-2 tablets by mouth every 4 (four) hours as needed for moderate pain. (Patient not taking: Reported on 06/20/2017), Disp: 12 tablet, Rfl: 0 .  Omega-3 Fatty Acids (FISH OIL) 1000 MG CAPS, Take 1,000 mg by mouth daily., Disp: , Rfl:  .  RABEprazole (ACIPHEX) 20 MG tablet, Take 20 mg by mouth daily., Disp: , Rfl:  .   sertraline (ZOLOFT) 50 MG tablet, Take 50 mg by mouth every morning. , Disp: , Rfl:  .  tamoxifen (NOLVADEX) 20 MG tablet, Take 1 tablet (20 mg total) by mouth daily., Disp: 30 tablet, Rfl: 3  Physical exam:  Vitals:   08/21/17 1005 08/21/17 1011  BP:  139/84  Pulse:  83  Resp:  14  Temp:  98.2 F (36.8 C)  TempSrc:  Tympanic  Weight: 161 lb (73 kg) 161 lb (73 kg)   Physical Exam  Constitutional: She is oriented to person, place, and time and well-developed, well-nourished, and in no distress.  HENT:  Head: Normocephalic and atraumatic.  Eyes: EOM are normal. Pupils are equal, round, and reactive to light.  Neck: Normal range of motion.  Cardiovascular: Normal rate, regular rhythm and normal heart sounds.  Pulmonary/Chest: Effort normal and breath sounds normal.  Abdominal: Soft. Bowel sounds are normal.  Neurological: She is alert and oriented to person, place, and time.  Skin: Skin is warm and dry.     CMP Latest Ref Rng & Units 08/21/2017  Glucose 65 - 99 mg/dL 104(H)  BUN 6 - 20 mg/dL 14  Creatinine 0.44 - 1.00 mg/dL 0.66  Sodium 135 - 145 mmol/L 139  Potassium 3.5 - 5.1 mmol/L 4.2  Chloride 101 - 111 mmol/L 103  CO2 22 - 32 mmol/L 28  Calcium 8.9 - 10.3 mg/dL 9.7  Total Protein 6.5 - 8.1 g/dL 7.6  Total Bilirubin 0.3 - 1.2 mg/dL 0.7  Alkaline Phos 38 - 126 U/L 62  AST 15 - 41 U/L 37  ALT 14 - 54 U/L 35   CBC Latest Ref Rng & Units 05/30/2017  WBC 3.6 - 11.0 K/uL 5.9  Hemoglobin 12.0 - 16.0 g/dL 13.4  Hematocrit 35.0 - 47.0 % 38.8  Platelets 150 - 440 K/uL 205      Assessment and plan- Patient is a 51 y.o. female stage IA pT2 N0 M0 invasive mammary carcinoma of the left breast ER/PR positive HER-2/neu negative s/p lumpectomy and adjuvant radiation currently on tamoxifen  Patient could not tolerate AI plus OS due to severe headaches. She is currently on tamoxifen and tolerating it well. lfts are normal. She will continue this for now and if she were to have no  menstrual cycles for a year on tamoxifen, I will check her hormone levels and switch her to AI at that time  She will continue  yearly Gyn exams and eye exams. Mammograms will be ordered by Dr. Ouida Sills  I will see her back in 3 months time.    Visit Diagnosis 1. Malignant neoplasm of upper-outer quadrant of left breast in female, estrogen receptor positive ([place])      Dr. Randa Evens, MD, MPH [facility] at [facility] Pager- [phone] 08/21/2017 11:58 AM

## 2017-08-21 ENCOUNTER — Inpatient Hospital Stay: Payer: BLUE CROSS/BLUE SHIELD | Attending: Oncology

## 2017-08-21 ENCOUNTER — Encounter: Payer: Self-pay | Admitting: Oncology

## 2017-08-21 ENCOUNTER — Inpatient Hospital Stay (HOSPITAL_BASED_OUTPATIENT_CLINIC_OR_DEPARTMENT_OTHER): Payer: BLUE CROSS/BLUE SHIELD | Admitting: Oncology

## 2017-08-21 ENCOUNTER — Telehealth: Payer: Self-pay | Admitting: Oncology

## 2017-08-21 VITALS — BP 139/84 | HR 83 | Temp 98.2°F | Resp 14 | Wt 161.0 lb

## 2017-08-21 DIAGNOSIS — I129 Hypertensive chronic kidney disease with stage 1 through stage 4 chronic kidney disease, or unspecified chronic kidney disease: Secondary | ICD-10-CM | POA: Diagnosis not present

## 2017-08-21 DIAGNOSIS — Z79810 Long term (current) use of selective estrogen receptor modulators (SERMs): Secondary | ICD-10-CM | POA: Diagnosis not present

## 2017-08-21 DIAGNOSIS — Z87440 Personal history of urinary (tract) infections: Secondary | ICD-10-CM

## 2017-08-21 DIAGNOSIS — F419 Anxiety disorder, unspecified: Secondary | ICD-10-CM

## 2017-08-21 DIAGNOSIS — R51 Headache: Secondary | ICD-10-CM | POA: Insufficient documentation

## 2017-08-21 DIAGNOSIS — Z79899 Other long term (current) drug therapy: Secondary | ICD-10-CM

## 2017-08-21 DIAGNOSIS — Z803 Family history of malignant neoplasm of breast: Secondary | ICD-10-CM

## 2017-08-21 DIAGNOSIS — Z87442 Personal history of urinary calculi: Secondary | ICD-10-CM | POA: Diagnosis not present

## 2017-08-21 DIAGNOSIS — Z170 Estrogen receptor positive status [ER+]: Secondary | ICD-10-CM | POA: Diagnosis not present

## 2017-08-21 DIAGNOSIS — C50412 Malignant neoplasm of upper-outer quadrant of left female breast: Secondary | ICD-10-CM | POA: Insufficient documentation

## 2017-08-21 DIAGNOSIS — K219 Gastro-esophageal reflux disease without esophagitis: Secondary | ICD-10-CM | POA: Diagnosis not present

## 2017-08-21 DIAGNOSIS — N189 Chronic kidney disease, unspecified: Secondary | ICD-10-CM | POA: Insufficient documentation

## 2017-08-21 LAB — COMPREHENSIVE METABOLIC PANEL
ALK PHOS: 62 U/L (ref 38–126)
ALT: 35 U/L (ref 14–54)
ANION GAP: 8 (ref 5–15)
AST: 37 U/L (ref 15–41)
Albumin: 4.6 g/dL (ref 3.5–5.0)
BUN: 14 mg/dL (ref 6–20)
CHLORIDE: 103 mmol/L (ref 101–111)
CO2: 28 mmol/L (ref 22–32)
CREATININE: 0.66 mg/dL (ref 0.44–1.00)
Calcium: 9.7 mg/dL (ref 8.9–10.3)
GFR calc non Af Amer: >60 mL/min (ref >60)
Glucose, Bld: 104 mg/dL — ABNORMAL HIGH (ref 65–99)
POTASSIUM: 4.2 mmol/L (ref 3.5–5.1)
SODIUM: 139 mmol/L (ref 135–145)
Total Bilirubin: 0.7 mg/dL (ref 0.3–1.2)
Total Protein: 7.6 g/dL (ref 6.5–8.1)

## 2017-08-21 NOTE — Telephone Encounter (Signed)
Cancel all previously scheduled appts, per 08/21/17 los.  See MD in 30 months. Patient is aware of changes and given an updated copy of appts.

## 2017-08-21 NOTE — Progress Notes (Signed)
Patient here today for follow up with lab work. She is feeling so much better since stopping the arimidex. She is tolerating the tamoxifen well.

## 2017-08-27 DIAGNOSIS — R7302 Impaired glucose tolerance (oral): Secondary | ICD-10-CM | POA: Insufficient documentation

## 2017-08-28 ENCOUNTER — Telehealth: Payer: Self-pay | Admitting: *Deleted

## 2017-08-28 NOTE — Telephone Encounter (Signed)
-----   Message from Sindy Guadeloupe, MD sent at 08/28/2017 11:38 AM EST ----- Yes ok to get shots  ----- Message ----- From: Livia Snellen, RN Sent: 08/28/2017  11:36 AM To: Sindy Guadeloupe, MD  Patient called to ask if it is ok for her to get the shingles vaccine and the tetanus vaccine. Her PMD suggested that she get both.  Please advise.  Thanks, [employer]

## 2017-08-28 NOTE — Telephone Encounter (Signed)
Spoke to patient via telephone and informed her that Dr. Janese Banks said it is ok to get the shingles vaccine and the tetanus vaccine. Patient verbalized understanding.    dhs

## 2017-09-03 ENCOUNTER — Ambulatory Visit: Payer: BLUE CROSS/BLUE SHIELD

## 2017-09-18 DIAGNOSIS — E782 Mixed hyperlipidemia: Secondary | ICD-10-CM | POA: Insufficient documentation

## 2017-09-28 DIAGNOSIS — I340 Nonrheumatic mitral (valve) insufficiency: Secondary | ICD-10-CM | POA: Insufficient documentation

## 2017-10-03 ENCOUNTER — Ambulatory Visit: Payer: BLUE CROSS/BLUE SHIELD

## 2017-11-05 ENCOUNTER — Ambulatory Visit: Payer: BLUE CROSS/BLUE SHIELD

## 2017-11-15 ENCOUNTER — Other Ambulatory Visit: Payer: Self-pay | Admitting: *Deleted

## 2017-11-15 MED ORDER — TAMOXIFEN CITRATE 20 MG PO TABS: 20.0000 mg | ORAL_TABLET | Freq: Every day | ORAL | 3 refills | Status: DC

## 2017-11-20 ENCOUNTER — Other Ambulatory Visit: Payer: Self-pay

## 2017-11-20 ENCOUNTER — Inpatient Hospital Stay: Payer: BLUE CROSS/BLUE SHIELD | Attending: Oncology | Admitting: Oncology

## 2017-11-20 ENCOUNTER — Encounter: Payer: Self-pay | Admitting: Oncology

## 2017-11-20 VITALS — BP 147/87 | HR 84 | Temp 97.9°F | Resp 18 | Ht 66.0 in | Wt 139.6 lb

## 2017-11-20 DIAGNOSIS — Z79810 Long term (current) use of selective estrogen receptor modulators (SERMs): Secondary | ICD-10-CM | POA: Insufficient documentation

## 2017-11-20 DIAGNOSIS — C50412 Malignant neoplasm of upper-outer quadrant of left female breast: Secondary | ICD-10-CM | POA: Diagnosis present

## 2017-11-20 DIAGNOSIS — Z170 Estrogen receptor positive status [ER+]: Principal | ICD-10-CM

## 2017-11-20 DIAGNOSIS — Z803 Family history of malignant neoplasm of breast: Secondary | ICD-10-CM | POA: Diagnosis not present

## 2017-11-20 DIAGNOSIS — Z923 Personal history of irradiation: Secondary | ICD-10-CM | POA: Insufficient documentation

## 2017-11-20 NOTE — Progress Notes (Signed)
No new changes

## 2017-11-22 NOTE — Progress Notes (Signed)
Hematology/Oncology Consult note [facility]  Telephone:([phone] [phone]  Patient Care Team: Alanson Aly, [place] as PCP - General (Family Medicine)   Name of the patient: Dana Harrell  [id number]  04-19-1966   Date of visit: 11/22/17  Diagnosis- stage IA pT2 N0 M0 invasive mammary carcinoma of the left breast ER/PR positive HER-2/neu negative    Chief complaint/ Reason for visit- f/u of ongoing symptoms of headache  Heme/Onc history: 1. Patient is a 52 year old premenopausal female who noticed a palpable left breast mass in April 2018. Her prior mammogram was in 2015 which was normal. She has not had any personal history of breast cancer or prior breast biopsies. She does have a strong family history of breast cancer in her maternal aunts. She did undergo genetic testing for her treatment over area and cancer in the past and genetic testing showed ATM [id number] of uncertain significance   2. Recent mammogram on 02/02/2017 showed: There is a lobular mass within the upper-outer left breast underlying the palpable the marker. Mass measures 2.5 x 2.1 cm on mammography. No additional concerning masses, calcifications or nonsurgical distortion identified within either breast.  Mammographic images were processed with CAD.   Targeted ultrasound is performed, showing a 2.0 x 1.8 x 1.6 cm irregular hypoechoic mass left breast 3 o'clock position 4 cm from nipple, corresponding with palpable abnormality. No left axillary adenopathy.  3. 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 ER PR positive her 2 negative   4. Patient is premenopausal.She is G5 P5 L5. Did not use birth control and did not breast feed. Menarche at the age of 52.  35. Patient underwent lumpectomy with SLNB on 03/09/17 which showed: DIAGNOSIS:  A. LEFT BREAST MASS, 3:00; EXCISION:   - INVASIVE MAMMARY CARCINOMA OF NO SPECIAL TYPE.  - HISTOLOGIC CHANGES CONSISTENT WITH BIOPSY SITE.  - ATYPICAL LOBULAR HYPERPLASIA.  - THE SURGICAL MARGINS ARE NEGATIVE.   B. LEFT BREAST, MEDIAL MARGIN; REEXCISION:  - NEGATIVE FOR INVASIVE CARCINOMA.  - ATYPICAL LOBULAR HYPERPLASIA.  - FIBROCYSTIC CHANGE.   C. SENTINEL LYMPH NODE, LEFT AXILLA; EXCISION:  - NO TUMOR SEEN IN ONE LYMPH NODE (0/1).  - SEE SUMMARY BELOW.   Surgical Pathology Cancer Case Summary   INVASIVE CARCINOMA OF THE BREAST  Procedure: Lumpectomy  Specimen Laterality: Left  Histologic Type: Invasive carcinoma of no special type  Histologic Grade (Nottingham Histologic Score)       Glandular (Acinar)/Tubular Differentiation: 3       Nuclear Pleomorphism: 2       Mitotic Rate: 2       Overall Grade: 2  Tumor Size: 23 mm  Ductal Carcinoma in Situ (DCIS): Not identified  Lymphovascular Invasion: Not identified  Treatment Effect: Not applicable  Margins:       Invasive carcinoma: Uninvolved by invasive carcinoma         Distance from closest margin: 3 mm from posterior margin   Regional Lymph nodes:    Total # lymph nodes examined: 1    # Sentinel lymph nodes examined: 1    # Lymph nodes with macrometastasis (>2.0 mm): 0    # Lymph nodes with isolated tumor cells (<0.2 mm): 0    # Lymph nodes with micrometastasis (>0.2 mm and <2.0 mm): 0   Pathologic Stage Classification (pTNM, AJCC 7th Edition)    TNM Descriptors: Not applicable  pTNM: pT2 pN0(sn)   6. Oncotype testing showed intermediate score of 21. Risks and benefits of chemotherapy as well results of ASCO 2018 TAILORx plenary data discussed. Adjuvant chemotherapy not given after mutual discussion. She completed adjuvant radiation. Baseline bone density scan normal  7. Patient started on lupron + AI in sept 2018. She had significant headaches after a few weeks of lupron and AI. AI was  stopped for 2 weeks but headaches did not resolve. Patient was therefore switched to tamoxifen   Interval history-she is tolerating tamoxifen well without any significant side effects.  She has not had any menstrual cycles for the last 2 months after being on tamoxifen.  ECOG PS- 0 Pain scale- 0   Review of systems- Review of Systems  Constitutional: Negative for chills, fever, malaise/fatigue and weight loss.  HENT: Negative for congestion, ear discharge and nosebleeds.   Eyes: Negative for blurred vision.  Respiratory: Negative for cough, hemoptysis, sputum production, shortness of breath and wheezing.   Cardiovascular: Negative for chest pain, palpitations, orthopnea and claudication.  Gastrointestinal: Negative for abdominal pain, blood in stool, constipation, diarrhea, heartburn, melena, nausea and vomiting.  Genitourinary: Negative for dysuria, flank pain, frequency, hematuria and urgency.  Musculoskeletal: Negative for back pain, joint pain and myalgias.  Skin: Negative for rash.  Neurological: Negative for dizziness, tingling, focal weakness, seizures, weakness and headaches.  Endo/Heme/Allergies: Does not bruise/bleed easily.  Psychiatric/Behavioral: Negative for depression and suicidal ideas. The patient does not have insomnia.       Allergies  Allergen Reactions  . Lovastatin Other (See Comments)    Chest pain  . Motrin [Ibuprofen] Other (See Comments)    Hx of renal failure  . Promethazine Other (See Comments)    Excessive sleepiness.     Past Medical History:  Diagnosis Date  . Anxiety   . Breast cancer ([place])   . Cancer ([place])   . Chronic kidney disease 2009   H/O ACUTE KIDNEY FAILURE WITH RHABDOMYOLOSIS  . GERD (gastroesophageal reflux disease)   . History of kidney stones   . Hypertension   . Rhabdomyolysis    2009  . UTI (urinary tract infection)      Past Surgical History:  Procedure Laterality Date  . CHOLECYSTECTOMY    . COLONOSCOPY     4/17    . MASTECTOMY, PARTIAL Left 03/09/2017   Procedure: MASTECTOMY PARTIAL;  Surgeon: Leonie Green, MD;  Location: [facility] ORS;  Service: General;  Laterality: Left;  . MS MAMMO SCREENING     4/18  . OTHER SURGICAL HISTORY     pap 4/18  . SENTINEL NODE BIOPSY Left 03/09/2017   Procedure: SENTINEL NODE BIOPSY;  Surgeon: Leonie Green, MD;  Location: [facility] ORS;  Service: General;  Laterality: Left;    Social History   Socioeconomic History  . Marital status: Married    Spouse name: Not on file  . Number of children: Not on file  . Years of education: Not on file  . Highest education level: Not on file  Social Needs  . Financial resource strain: Not on file  . Food insecurity - worry: Not on file  . Food insecurity - inability: Not on file  . Transportation needs - medical: Not on file  . Transportation needs - non-medical: Not on file  Occupational History  . Not on file  Tobacco Use  . Smoking status: Never Smoker  . Smokeless tobacco: Never Used  Substance and Sexual Activity  . Alcohol use: Yes  Comment: socially  . Drug use: No  . Sexual activity: Yes  Other Topics Concern  . Not on file  Social History Narrative  . Not on file    Family History  Problem Relation Age of Onset  . Colon cancer Mother 48  . Breast cancer Maternal Aunt        3 materal aunts all 70's  . Breast cancer Maternal Aunt   . Breast cancer Maternal Aunt      Current Outpatient Medications:  .  amLODipine (NORVASC) 10 MG tablet, Take 10 mg by mouth every morning. , Disp: , Rfl:  .  RABEprazole (ACIPHEX) 20 MG tablet, Take 20 mg by mouth daily., Disp: , Rfl:  .  sertraline (ZOLOFT) 50 MG tablet, Take 50 mg by mouth every morning. , Disp: , Rfl:  .  tamoxifen (NOLVADEX) 20 MG tablet, Take 1 tablet (20 mg total) by mouth daily., Disp: 30 tablet, Rfl: 3 .  acetaminophen (TYLENOL) 500 MG tablet, Take 1,000 mg by mouth every 6 (six) hours as needed., Disp: , Rfl:  .  Omega-3 Fatty Acids  (FISH OIL) 1000 MG CAPS, Take 1,000 mg by mouth daily., Disp: , Rfl:   Physical exam:  Vitals:   11/20/17 1517  BP: (!) 147/87  Pulse: 84  Resp: 18  Temp: 97.9 F (36.6 C)  TempSrc: Tympanic  Weight: 139 lb 9.6 oz (63.3 kg)  Height: 5' 6"  (1.676 m)   Physical Exam  Constitutional: She is oriented to person, place, and time and well-developed, well-nourished, and in no distress.  HENT:  Head: Normocephalic and atraumatic.  Eyes: EOM are normal. Pupils are equal, round, and reactive to light.  Neck: Normal range of motion.  Cardiovascular: Normal rate, regular rhythm and normal heart sounds.  Pulmonary/Chest: Effort normal and breath sounds normal.  Abdominal: Soft. Bowel sounds are normal.  Neurological: She is alert and oriented to person, place, and time.  Skin: Skin is warm and dry.     CMP Latest Ref Rng & Units 08/21/2017  Glucose 65 - 99 mg/dL 104(H)  BUN 6 - 20 mg/dL 14  Creatinine 0.44 - 1.00 mg/dL 0.66  Sodium 135 - 145 mmol/L 139  Potassium 3.5 - 5.1 mmol/L 4.2  Chloride 101 - 111 mmol/L 103  CO2 22 - 32 mmol/L 28  Calcium 8.9 - 10.3 mg/dL 9.7  Total Protein 6.5 - 8.1 g/dL 7.6  Total Bilirubin 0.3 - 1.2 mg/dL 0.7  Alkaline Phos 38 - 126 U/L 62  AST 15 - 41 U/L 37  ALT 14 - 54 U/L 35   CBC Latest Ref Rng & Units 05/30/2017  WBC 3.6 - 11.0 K/uL 5.9  Hemoglobin 12.0 - 16.0 g/dL 13.4  Hematocrit 35.0 - 47.0 % 38.8  Platelets 150 - 440 K/uL 205     Assessment and plan- Patient is a 52 y.o. female stage IA pT2 N0 M0 invasive mammary carcinoma of the left breast ER/PR positive HER-2/neu negative s/p lumpectomy and adjuvant radiation currently on tamoxifen  Patient will continue tamoxifen at this time.  If she continues to have no menstrual cycles for 1 year I will plan to switch her to AI after checking her hormone levels.  She is due for routine screening mammogram in April 2018 which will be coordinated by Dr. Tamala Julian.  She will be seeing him following the  mammogram.  I will see her back in 6 months time with repeat CBC and CMP   Visit Diagnosis 1. Malignant  neoplasm of upper-outer quadrant of left breast in female, estrogen receptor positive ([place])   2. Long-term current use of tamoxifen      Dr. Randa Evens, MD, MPH [facility] at [facility] Pager- [phone] 11/22/2017 10:43 AM

## 2017-12-12 ENCOUNTER — Ambulatory Visit
Admission: RE | Admit: 2017-12-12 | Discharge: 2017-12-12 | Disposition: A | Discharge status: Home / Self Care | Payer: BLUE CROSS/BLUE SHIELD | Source: Ambulatory Visit | Attending: Radiation Oncology | Admitting: Radiation Oncology

## 2017-12-12 ENCOUNTER — Encounter: Payer: Self-pay | Admitting: Radiation Oncology

## 2017-12-12 ENCOUNTER — Other Ambulatory Visit: Payer: Self-pay

## 2017-12-12 VITALS — BP 161/93 | HR 80 | Temp 98.3°F | Resp 18 | Wt 135.0 lb

## 2017-12-12 DIAGNOSIS — Z170 Estrogen receptor positive status [ER+]: Secondary | ICD-10-CM | POA: Insufficient documentation

## 2017-12-12 DIAGNOSIS — Z79810 Long term (current) use of selective estrogen receptor modulators (SERMs): Secondary | ICD-10-CM | POA: Insufficient documentation

## 2017-12-12 DIAGNOSIS — Z923 Personal history of irradiation: Secondary | ICD-10-CM | POA: Insufficient documentation

## 2017-12-12 DIAGNOSIS — C50412 Malignant neoplasm of upper-outer quadrant of left female breast: Secondary | ICD-10-CM | POA: Diagnosis not present

## 2017-12-12 NOTE — Progress Notes (Signed)
Radiation Oncology Follow up Note  Name: Dana Harrell   Date:   12/12/2017 MRN:  [id number] DOB: 07/13/1966    This 52 y.o. female presents to the clinic today for six-month follow-up status post whole breast radiation to her left breast for ER/PR positive stage I invasive mammary carcinoma.  REFERRING PROVIDER: Geyer, Katherine, FNP  HPI: Patient is a 52-year-old female now out 6 months having completed whole breast radiation to her left breast for stage I (T2 N0 M0) ER/PR positive HER-2/neu negative invasive mammary carcinoma. Seen today in routine follow-up she is doing well. She specifically denies breast tenderness cough or bone pain. She scheduled for follow-up mammogram in April. She's currently on tamoxifen tolerating that well without side effect.  COMPLICATIONS OF TREATMENT: none  FOLLOW UP COMPLIANCE: keeps appointments   PHYSICAL EXAM:  BP (!) 161/93   Pulse 80   Temp 98.3 F (36.8 C)   Resp 18   Wt 135 lb 0.5 oz (61.3 kg)   BMI 21.79 kg/m  Lungs are clear to A&P cardiac examination essentially unremarkable with regular rate and rhythm. No dominant mass or nodularity is noted in either breast in 2 positions examined. Incision is well-healed. No axillary or supraclavicular adenopathy is appreciated. Cosmetic result is excellent. Well-developed well-nourished patient in NAD. HEENT reveals PERLA, EOMI, discs not visualized.  Oral cavity is clear. No oral mucosal lesions are identified. Neck is clear without evidence of cervical or supraclavicular adenopathy. Lungs are clear to A&P. Cardiac examination is essentially unremarkable with regular rate and rhythm without murmur rub or thrill. Abdomen is benign with no organomegaly or masses noted. Motor sensory and DTR levels are equal and symmetric in the upper and lower extremities. Cranial nerves II through XII are grossly intact. Proprioception is intact. No peripheral adenopathy or edema is identified. No motor or sensory levels are  noted. Crude visual fields are within normal range.  RADIOLOGY RESULTS: No current films for review  PLAN: Present time she continues to do well with no evidence of disease. I've asked to see her back in 6 months for follow-up and then will go to once your follow-up visits. She continues on tamoxifen without side effect. Patient is to call with any concerns.  I would like to take this opportunity to thank you for allowing me to participate in the care of your patient..    Glenn Chrystal, MD   

## 2018-02-06 ENCOUNTER — Encounter: Payer: Self-pay | Admitting: *Deleted

## 2018-02-07 ENCOUNTER — Other Ambulatory Visit: Payer: Self-pay | Admitting: Obstetrics and Gynecology

## 2018-02-07 DIAGNOSIS — Z1239 Encounter for other screening for malignant neoplasm of breast: Secondary | ICD-10-CM

## 2018-03-19 ENCOUNTER — Other Ambulatory Visit: Payer: Self-pay | Admitting: Oncology

## 2018-03-26 ENCOUNTER — Ambulatory Visit
Admission: RE | Admit: 2018-03-26 | Discharge: 2018-03-26 | Disposition: A | Discharge status: Home / Self Care | Payer: BLUE CROSS/BLUE SHIELD | Source: Ambulatory Visit | Attending: Obstetrics and Gynecology | Admitting: Obstetrics and Gynecology

## 2018-03-26 DIAGNOSIS — Z1239 Encounter for other screening for malignant neoplasm of breast: Secondary | ICD-10-CM

## 2018-03-26 DIAGNOSIS — Z1231 Encounter for screening mammogram for malignant neoplasm of breast: Secondary | ICD-10-CM | POA: Diagnosis not present

## 2018-03-26 HISTORY — DX: Personal history of irradiation: Z92.3

## 2018-05-21 ENCOUNTER — Inpatient Hospital Stay: Payer: BLUE CROSS/BLUE SHIELD

## 2018-05-21 ENCOUNTER — Encounter: Payer: Self-pay | Admitting: Oncology

## 2018-05-21 ENCOUNTER — Inpatient Hospital Stay: Payer: BLUE CROSS/BLUE SHIELD | Attending: Oncology | Admitting: Oncology

## 2018-05-21 ENCOUNTER — Other Ambulatory Visit: Payer: Self-pay

## 2018-05-21 VITALS — BP 135/81 | HR 74 | Temp 97.8°F | Resp 18 | Ht 66.0 in | Wt 121.9 lb

## 2018-05-21 DIAGNOSIS — Z170 Estrogen receptor positive status [ER+]: Secondary | ICD-10-CM

## 2018-05-21 DIAGNOSIS — Z79899 Other long term (current) drug therapy: Secondary | ICD-10-CM | POA: Diagnosis not present

## 2018-05-21 DIAGNOSIS — C50412 Malignant neoplasm of upper-outer quadrant of left female breast: Secondary | ICD-10-CM

## 2018-05-21 DIAGNOSIS — Z803 Family history of malignant neoplasm of breast: Secondary | ICD-10-CM | POA: Diagnosis not present

## 2018-05-21 DIAGNOSIS — Z923 Personal history of irradiation: Secondary | ICD-10-CM

## 2018-05-21 DIAGNOSIS — Z853 Personal history of malignant neoplasm of breast: Secondary | ICD-10-CM

## 2018-05-21 DIAGNOSIS — Z800 Family history of malignant neoplasm of digestive organs: Secondary | ICD-10-CM

## 2018-05-21 DIAGNOSIS — Z08 Encounter for follow-up examination after completed treatment for malignant neoplasm: Secondary | ICD-10-CM

## 2018-05-21 DIAGNOSIS — Z79810 Long term (current) use of selective estrogen receptor modulators (SERMs): Secondary | ICD-10-CM | POA: Diagnosis not present

## 2018-05-21 LAB — CBC
HEMATOCRIT: 35.7 % (ref 35.0–47.0)
Hemoglobin: 12.1 g/dL (ref 12.0–16.0)
MCH: 30.3 pg (ref 26.0–34.0)
MCHC: 33.7 g/dL (ref 32.0–36.0)
MCV: 89.9 fL (ref 80.0–100.0)
PLATELETS: 177 10*3/uL (ref 150–440)
RBC: 3.98 MIL/uL (ref 3.80–5.20)
RDW: 13.9 % (ref 11.5–14.5)
WBC: 4.6 10*3/uL (ref 3.6–11.0)

## 2018-05-21 LAB — COMPREHENSIVE METABOLIC PANEL
ALBUMIN: 4.2 g/dL (ref 3.5–5.0)
ALT: 28 U/L (ref 0–44)
AST: 45 U/L — AB (ref 15–41)
Alkaline Phosphatase: 41 U/L (ref 38–126)
Anion gap: 8 (ref 5–15)
BUN: 13 mg/dL (ref 6–20)
CO2: 25 mmol/L (ref 22–32)
CREATININE: 0.61 mg/dL (ref 0.44–1.00)
Calcium: 9.7 mg/dL (ref 8.9–10.3)
Chloride: 106 mmol/L (ref 98–111)
GFR calc Af Amer: >60 mL/min (ref >60)
GFR calc non Af Amer: >60 mL/min (ref >60)
GLUCOSE: 120 mg/dL — AB (ref 70–99)
POTASSIUM: 3.9 mmol/L (ref 3.5–5.1)
Sodium: 139 mmol/L (ref 135–145)
TOTAL PROTEIN: 6.9 g/dL (ref 6.5–8.1)
Total Bilirubin: 0.5 mg/dL (ref 0.3–1.2)

## 2018-05-23 NOTE — Progress Notes (Signed)
Hematology/Oncology Consult note [facility]  Telephone:([phone] [phone]  Patient Care Team: Alanson Aly, [place] as PCP - General (Family Medicine)   Name of the patient: Dana Harrell  [id number]  November 02, 1965   Date of visit: 05/23/18  Diagnosis- stage IA pT2 N0 M0 invasive mammary carcinoma of the left breast ER/PR positive HER-2/neu negative  Chief complaint/ Reason for visit-routine follow-up of breast cancer on tamoxifen  Heme/Onc history: 1. Patient is a 52 year old premenopausal female who noticed a palpable left breast mass in April 2018. Her prior mammogram was in 2015 which was normal. She has not had any personal history of breast cancer or prior breast biopsies. She does have a strong family history of breast cancer in her maternal aunts. She did undergo genetic testing for her treatment over area and cancer in the past and genetic testing showed ATM [id number] of uncertain significance   2. Recent mammogram on 02/02/2017 showed: There is a lobular mass within the upper-outer left breast underlying the palpable the marker. Mass measures 2.5 x 2.1 cm on mammography. No additional concerning masses, calcifications or nonsurgical distortion identified within either breast.  Mammographic images were processed with CAD.   Targeted ultrasound is performed, showing a 2.0 x 1.8 x 1.6 cm irregular hypoechoic mass left breast 3 o'clock position 4 cm from nipple, corresponding with palpable abnormality. No left axillary adenopathy.  3. 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 ER PR positive her 2 negative   4. Patient is premenopausal.She is G5 P5 L5. Did not use birth control and did not breast feed. Menarche at the age of 27.  22. Patient underwent lumpectomy with SLNB on 03/09/17 which showed: DIAGNOSIS:  A. LEFT BREAST MASS, 3:00;  EXCISION:  - INVASIVE MAMMARY CARCINOMA OF NO SPECIAL TYPE.  - HISTOLOGIC CHANGES CONSISTENT WITH BIOPSY SITE.  - ATYPICAL LOBULAR HYPERPLASIA.  - THE SURGICAL MARGINS ARE NEGATIVE.   B. LEFT BREAST, MEDIAL MARGIN; REEXCISION:  - NEGATIVE FOR INVASIVE CARCINOMA.  - ATYPICAL LOBULAR HYPERPLASIA.  - FIBROCYSTIC CHANGE.   C. SENTINEL LYMPH NODE, LEFT AXILLA; EXCISION:  - NO TUMOR SEEN IN ONE LYMPH NODE (0/1).  - SEE SUMMARY BELOW.   Surgical Pathology Cancer Case Summary   INVASIVE CARCINOMA OF THE BREAST  Procedure: Lumpectomy  Specimen Laterality: Left  Histologic Type: Invasive carcinoma of no special type  Histologic Grade (Nottingham Histologic Score)       Glandular (Acinar)/Tubular Differentiation: 3       Nuclear Pleomorphism: 2       Mitotic Rate: 2       Overall Grade: 2  Tumor Size: 23 mm  Ductal Carcinoma in Situ (DCIS): Not identified  Lymphovascular Invasion: Not identified  Treatment Effect: Not applicable  Margins:       Invasive carcinoma: Uninvolved by invasive carcinoma         Distance from closest margin: 3 mm from posterior margin   Regional Lymph nodes:    Total # lymph nodes examined: 1    # Sentinel lymph nodes examined: 1    # Lymph nodes with macrometastasis (>2.0 mm): 0    # Lymph nodes with isolated tumor cells (<0.2 mm): 0    # Lymph nodes with micrometastasis (>0.2 mm and <2.0 mm): 0   Pathologic Stage Classification (pTNM, AJCC 7th Edition)    TNM Descriptors: Not applicable    pTNM: pT2  pN0(sn)   6. Oncotype testing showed intermediate score of 21. Risks and benefits of chemotherapy as well results of ASCO 2018 TAILORx plenary data discussed. Adjuvant chemotherapy not given after mutual discussion. She completed adjuvant radiation. Baseline bone density scan normal  7. Patient started on lupron + AI in sept 2018. She had significant headaches after a few weeks of lupron and AI.  AI was stopped for 2 weeks but headaches did not resolve. Patient was therefore switched to tamoxifen   Interval history-patient has lost 40 pounds since November 2018 as she has been following a keto diet and actively trying to lose weight.  She does not admit that she may have last more than she would have liked and is focusing on gaining some of it back.  She has not had any menstrual cycles since she started tamoxifen.  She is tolerating tamoxifen well without any significant side effects such as mood swings arthralgias hot flashes or fatigue  ECOG PS- 0 Pain scale- 0 Opioid associated constipation- no  Review of systems- Review of Systems  Constitutional: Negative for chills, fever, malaise/fatigue and weight loss.  HENT: Negative for congestion, ear discharge and nosebleeds.   Eyes: Negative for blurred vision.  Respiratory: Negative for cough, hemoptysis, sputum production, shortness of breath and wheezing.   Cardiovascular: Negative for chest pain, palpitations, orthopnea and claudication.  Gastrointestinal: Negative for abdominal pain, blood in stool, constipation, diarrhea, heartburn, melena, nausea and vomiting.  Genitourinary: Negative for dysuria, flank pain, frequency, hematuria and urgency.  Musculoskeletal: Negative for back pain, joint pain and myalgias.  Skin: Negative for rash.  Neurological: Negative for dizziness, tingling, focal weakness, seizures, weakness and headaches.  Endo/Heme/Allergies: Does not bruise/bleed easily.  Psychiatric/Behavioral: Negative for depression and suicidal ideas. The patient does not have insomnia.       Allergies  Allergen Reactions  . Lovastatin Other (See Comments)    Chest pain  . Motrin [Ibuprofen] Other (See Comments)    Hx of renal failure  . Promethazine Other (See Comments)    Excessive sleepiness.     Past Medical History:  Diagnosis Date  . Anxiety   . Breast cancer ([place]) 2018   invasive mammary and low grade DCIS    . Chronic kidney disease 2009   H/O ACUTE KIDNEY FAILURE WITH RHABDOMYOLOSIS  . GERD (gastroesophageal reflux disease)   . History of kidney stones   . Hypertension   . Personal history of radiation therapy 2018   left breast cancer  . Rhabdomyolysis    2009  . UTI (urinary tract infection)      Past Surgical History:  Procedure Laterality Date  . 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 BREAST LUMPECTOMY Left 2018  . CHOLECYSTECTOMY    . COLONOSCOPY     4/17  . MASTECTOMY, PARTIAL Left 03/09/2017   Procedure: MASTECTOMY PARTIAL;  Surgeon: Leonie Green, MD;  Location: [facility] ORS;  Service: General;  Laterality: Left;  . MS MAMMO SCREENING     4/18  . OTHER SURGICAL HISTORY     pap 4/18  . SENTINEL NODE BIOPSY Left 03/09/2017   Procedure: SENTINEL NODE BIOPSY;  Surgeon: Leonie Green, MD;  Location: [facility] ORS;  Service: General;  Laterality: Left;    Social History   Socioeconomic History  . Marital status: Married    Spouse name: Not on file  . Number of children: Not on file  . Years of education: Not  on file  . Highest education level: Not on file  Occupational History  . Not on file  Social Needs  . Financial resource strain: Not on file  . Food insecurity:    Worry: Not on file    Inability: Not on file  . Transportation needs:    Medical: Not on file    Non-medical: Not on file  Tobacco Use  . Smoking status: Never Smoker  . Smokeless tobacco: Never Used  Substance and Sexual Activity  . Alcohol use: Yes    Comment: socially  . Drug use: No  . Sexual activity: Yes  Lifestyle  . Physical activity:    Days per week: Not on file    Minutes per session: Not on file  . Stress: Not on file  Relationships  . Social connections:    Talks on phone: Not on file    Gets together: Not on file    Attends religious service: Not on file    Active member of club or organization: Not on file    Attends meetings of  clubs or organizations: Not on file    Relationship status: Not on file  . Intimate partner violence:    Fear of current or ex partner: Not on file    Emotionally abused: Not on file    Physically abused: Not on file    Forced sexual activity: Not on file  Other Topics Concern  . Not on file  Social History Narrative  . Not on file    Family History  Problem Relation Age of Onset  . Colon cancer Mother 58  . Breast cancer Maternal Aunt        3 materal aunts all 46's  . Breast cancer Maternal Aunt   . Breast cancer Maternal Aunt      Current Outpatient Medications:  .  amLODipine (NORVASC) 10 MG tablet, Take 10 mg by mouth every morning. , Disp: , Rfl:  .  RABEprazole (ACIPHEX) 20 MG tablet, Take 20 mg by mouth daily., Disp: , Rfl:  .  sertraline (ZOLOFT) 50 MG tablet, Take 50 mg by mouth every morning. , Disp: , Rfl:  .  tamoxifen (NOLVADEX) 20 MG tablet, TAKE 1 TABLET BY MOUTH EVERY DAY, Disp: 30 tablet, Rfl: 3 .  acetaminophen (TYLENOL) 500 MG tablet, Take 1,000 mg by mouth every 6 (six) hours as needed., Disp: , Rfl:  .  Omega-3 Fatty Acids (FISH OIL) 1000 MG CAPS, Take 1,000 mg by mouth daily., Disp: , Rfl:   Physical exam:  Vitals:   05/21/18 1436  BP: 135/81  Pulse: 74  Resp: 18  Temp: 97.8 F (36.6 C)  TempSrc: Tympanic  SpO2: 98%  Weight: 121 lb 14.4 oz (55.3 kg)  Height: _0  (1.676 m)   Physical Exam  Constitutional: She is oriented to person, place, and time.  Thin female in no acute distress  HENT:  Head: Normocephalic and atraumatic.  Eyes: Pupils are equal, round, and reactive to light. EOM are normal.  Neck: Normal range of motion.  Cardiovascular: Normal rate, regular rhythm and normal heart sounds.  Pulmonary/Chest: Effort normal and breath sounds normal.  Abdominal: Soft. Bowel sounds are normal.  Neurological: She is alert and oriented to person, place, and time.  Skin: Skin is warm and dry.     CMP Latest Ref Rng & Units 05/21/2018    Glucose 70 - 99 mg/dL 120(H)  BUN 6 - 20 mg/dL 13  Creatinine 0.44 - 1.00  mg/dL 0.61  Sodium 135 - 145 mmol/L 139  Potassium 3.5 - 5.1 mmol/L 3.9  Chloride 98 - 111 mmol/L 106  CO2 22 - 32 mmol/L 25  Calcium 8.9 - 10.3 mg/dL 9.7  Total Protein 6.5 - 8.1 g/dL 6.9  Total Bilirubin 0.3 - 1.2 mg/dL 0.5  Alkaline Phos 38 - 126 U/L 41  AST 15 - 41 U/L 45(H)  ALT 0 - 44 U/L 28   CBC Latest Ref Rng & Units 05/21/2018  WBC 3.6 - 11.0 K/uL 4.6  Hemoglobin 12.0 - 16.0 g/dL 12.1  Hematocrit 35.0 - 47.0 % 35.7  Platelets 150 - 440 K/uL 177     Assessment and plan- Patient is a 52 y.o. female  stage IA pT2 N0 M0 invasive mammary carcinoma of the left breast ER/PR positive HER-2/neu negative s/p lumpectomy and adjuvant radiationcurrently on tamoxifen.  She is here for routine breast cancer surveillance  Recent mammogram from March 31, 2018 did not reveal any evidence of malignancy.  Clinically she is doing well and there is no evidence of recurrence on exam.  Breast exam was not done today since she had a recent mammogram a month ago.  I will see her back in 6 months for a routine physical and breast exam.  At that time I will also check her [place] and estradiol levels.  If they are in postmenopausal range and since she has not had any menstrual cycles on tamoxifen I will consider switching her to AI at that time.   Visit Diagnosis 1. Malignant neoplasm of upper-outer quadrant of left breast in female, estrogen receptor positive ([place])   2. Encounter for follow-up surveillance of breast cancer      Dr. Randa Evens, MD, MPH [facility] at [facility] [phone] 05/23/2018 8:32 AM

## 2018-06-27 ENCOUNTER — Ambulatory Visit: Payer: BLUE CROSS/BLUE SHIELD | Admitting: Radiation Oncology

## 2018-07-02 ENCOUNTER — Other Ambulatory Visit: Payer: Self-pay | Admitting: Oncology

## 2018-07-04 ENCOUNTER — Other Ambulatory Visit: Payer: Self-pay

## 2018-07-04 ENCOUNTER — Ambulatory Visit
Admission: RE | Admit: 2018-07-04 | Discharge: 2018-07-04 | Disposition: A | Discharge status: Home / Self Care | Payer: BLUE CROSS/BLUE SHIELD | Source: Ambulatory Visit | Attending: Radiation Oncology | Admitting: Radiation Oncology

## 2018-07-04 ENCOUNTER — Encounter: Payer: Self-pay | Admitting: Radiation Oncology

## 2018-07-04 VITALS — BP 127/80 | HR 88 | Temp 97.4°F | Resp 12 | Ht 66.0 in | Wt 120.4 lb

## 2018-07-04 DIAGNOSIS — C50412 Malignant neoplasm of upper-outer quadrant of left female breast: Secondary | ICD-10-CM | POA: Diagnosis not present

## 2018-07-04 DIAGNOSIS — Z79810 Long term (current) use of selective estrogen receptor modulators (SERMs): Secondary | ICD-10-CM | POA: Diagnosis not present

## 2018-07-04 DIAGNOSIS — Z170 Estrogen receptor positive status [ER+]: Secondary | ICD-10-CM | POA: Insufficient documentation

## 2018-07-04 DIAGNOSIS — Z923 Personal history of irradiation: Secondary | ICD-10-CM | POA: Insufficient documentation

## 2018-07-04 NOTE — Progress Notes (Signed)
Radiation Oncology Follow up Note  Name: Dana Harrell   Date:   07/04/2018 MRN:  [id number] DOB: [birth year]    This 52 y.o. female presents to the clinic today for 1 year follow-up status post whole breast radiation to left breast for stage I ER/PR positive invasive mammary carcinoma.  REFERRING PROVIDER: Alanson Aly, FNP  HPI: patient is a 52 year old female now out 1 year having completed radiation therapy to her left breast for a T2 N0 M0 ER/PR positive HER-2/neu negative invasive mammary carcinoma. Seen today in routine follow-up she is doing well. She specifically denies breast tenderness cough or bone pain. Patient been on a weight loss diet loss about 60 pounds over the past year. She had a mammogram back in.June which I have reviewed was BI-RADS 2 benign.she's currently on tamoxifen tolerating that well without side effect.  COMPLICATIONS OF TREATMENT: none  FOLLOW UP COMPLIANCE: keeps appointments   PHYSICAL EXAM:  BP 127/80 (BP Location: Right Arm, Patient Position: Sitting)   Pulse 88   Temp (!) 97.4 F (36.3 C) (Tympanic)   Resp 12   Ht 5' 6"  (1.676 m)   Wt 120 lb 6.4 oz (54.6 kg)   BMI 19.43 kg/m  Lungs are clear to A&P cardiac examination essentially unremarkable with regular rate and rhythm. No dominant mass or nodularity is noted in either breast in 2 positions examined. Incision is well-healed. No axillary or supraclavicular adenopathy is appreciated. Cosmetic result is excellent.Well-developed well-nourished patient in NAD. HEENT reveals PERLA, EOMI, discs not visualized.  Oral cavity is clear. No oral mucosal lesions are identified. Neck is clear without evidence of cervical or supraclavicular adenopathy. Lungs are clear to A&P. Cardiac examination is essentially unremarkable with regular rate and rhythm without murmur rub or thrill. Abdomen is benign with no organomegaly or masses noted. Motor sensory and DTR levels are equal and symmetric in the upper and lower  extremities. Cranial nerves II through XII are grossly intact. Proprioception is intact. No peripheral adenopathy or edema is identified. No motor or sensory levels are noted. Crude visual fields are within normal range.  RADIOLOGY RESULTS: mammograms are reviewed and compatible with the above-stated findings  PLAN: at the present time patient is doing well with no evidence of disease. I am please were overall progress. I've asked to see her back in 1 year for follow-up. She continues on tamoxifen without side effect. Patient is to call with any concerns.  I would like to take this opportunity to thank you for allowing me to participate in the care of your patient.Noreene Filbert, MD

## 2018-11-18 ENCOUNTER — Other Ambulatory Visit: Payer: Self-pay

## 2018-11-19 ENCOUNTER — Inpatient Hospital Stay (HOSPITAL_BASED_OUTPATIENT_CLINIC_OR_DEPARTMENT_OTHER): Payer: BLUE CROSS/BLUE SHIELD | Admitting: Oncology

## 2018-11-19 ENCOUNTER — Encounter: Payer: Self-pay | Admitting: Oncology

## 2018-11-19 ENCOUNTER — Inpatient Hospital Stay: Payer: BLUE CROSS/BLUE SHIELD | Attending: Oncology

## 2018-11-19 VITALS — BP 128/78 | HR 80 | Temp 98.3°F | Resp 16 | Wt 126.1 lb

## 2018-11-19 DIAGNOSIS — Z923 Personal history of irradiation: Secondary | ICD-10-CM | POA: Diagnosis not present

## 2018-11-19 DIAGNOSIS — Z170 Estrogen receptor positive status [ER+]: Secondary | ICD-10-CM | POA: Diagnosis not present

## 2018-11-19 DIAGNOSIS — R945 Abnormal results of liver function studies: Secondary | ICD-10-CM | POA: Diagnosis not present

## 2018-11-19 DIAGNOSIS — Z79810 Long term (current) use of selective estrogen receptor modulators (SERMs): Secondary | ICD-10-CM | POA: Diagnosis not present

## 2018-11-19 DIAGNOSIS — Z13820 Encounter for screening for osteoporosis: Secondary | ICD-10-CM

## 2018-11-19 DIAGNOSIS — Z79899 Other long term (current) drug therapy: Secondary | ICD-10-CM | POA: Insufficient documentation

## 2018-11-19 DIAGNOSIS — N189 Chronic kidney disease, unspecified: Secondary | ICD-10-CM

## 2018-11-19 DIAGNOSIS — I129 Hypertensive chronic kidney disease with stage 1 through stage 4 chronic kidney disease, or unspecified chronic kidney disease: Secondary | ICD-10-CM | POA: Diagnosis not present

## 2018-11-19 DIAGNOSIS — Z803 Family history of malignant neoplasm of breast: Secondary | ICD-10-CM | POA: Diagnosis not present

## 2018-11-19 DIAGNOSIS — C50412 Malignant neoplasm of upper-outer quadrant of left female breast: Secondary | ICD-10-CM | POA: Insufficient documentation

## 2018-11-19 DIAGNOSIS — R7989 Other specified abnormal findings of blood chemistry: Secondary | ICD-10-CM

## 2018-11-19 LAB — COMPREHENSIVE METABOLIC PANEL
ALBUMIN: 4.3 g/dL (ref 3.5–5.0)
ALK PHOS: 60 U/L (ref 38–126)
ALT: 74 U/L — ABNORMAL HIGH (ref 0–44)
ANION GAP: 4 — AB (ref 5–15)
AST: 84 U/L — ABNORMAL HIGH (ref 15–41)
BILIRUBIN TOTAL: 0.4 mg/dL (ref 0.3–1.2)
BUN: 19 mg/dL (ref 6–20)
CALCIUM: 9.2 mg/dL (ref 8.9–10.3)
CO2: 29 mmol/L (ref 22–32)
Chloride: 108 mmol/L (ref 98–111)
Creatinine, Ser: 0.68 mg/dL (ref 0.44–1.00)
GFR calc Af Amer: >60 mL/min (ref >60)
GFR calc non Af Amer: >60 mL/min (ref >60)
GLUCOSE: 120 mg/dL — AB (ref 70–99)
Potassium: 4 mmol/L (ref 3.5–5.1)
Sodium: 141 mmol/L (ref 135–145)
TOTAL PROTEIN: 7.3 g/dL (ref 6.5–8.1)

## 2018-11-19 NOTE — Progress Notes (Signed)
Pt in for 6 month follow up.  Pt denies any difficulties or concerns.

## 2018-11-20 LAB — ESTRADIOL: ESTRADIOL: 78.3 pg/mL

## 2018-11-20 LAB — FOLLICLE STIMULATING HORMONE: FSH: 74.2 m[IU]/mL

## 2018-11-21 ENCOUNTER — Telehealth: Payer: Self-pay

## 2018-11-21 ENCOUNTER — Telehealth: Payer: Self-pay | Admitting: *Deleted

## 2018-11-21 NOTE — Telephone Encounter (Signed)
Called patient to let her know that when she left the clinic a couple days ago her liver enzymes had not come back yet from the lab results.  2 of her liver enzymes were a little elevated.  Dr. Janese Banks just wanted her to come back in 1 month and have the levels repeated.  I asked that she drinks alcohol.  The patient says yes she does but the day before she came in was her birthday and she celebrated and had a little bit more drinking than she usually does.  Told her that we just repeated and it will show up in her appointment scheduled for March 11 and she is agreeable to have this repeated.  I also told her that she needs a bone density test which is just to show the strength of her bones and that is what we normally would do when patients are on medicine for the breast cancer.  We scheduled it for the same day that she has her mammogram.  Agreeable to all of this and is been put in the mail to come to her house with the appointments

## 2018-11-21 NOTE — Telephone Encounter (Signed)
Left message on pt's v/m stating to continue on Tamoxifen and that levels will be repeated in 6 mo. Left number for return call if she has any questions.

## 2018-11-22 NOTE — Progress Notes (Signed)
Hematology/Oncology Consult note [facility]  Telephone:([phone] [phone]  Patient Care Team: Alanson Aly, [place] as PCP - General (Family Medicine)   Name of the patient: Dana Harrell  [id number]  October 30, 1965   Date of visit: 11/22/18  Diagnosis- stage IA pT2 N0 M0 invasive mammary carcinoma of the left breast ER/PR positive HER-2/neu negative  Chief complaint/ Reason for visit-routine follow-up of breast cancer on tamoxifen  Heme/Onc history: 1. Patient is a 53 year old premenopausal female who noticed a palpable left breast mass in April 2018. Her prior mammogram was in 2015 which was normal. She has not had any personal history of breast cancer or prior breast biopsies. She does have a strong family history of breast cancer in her maternal aunts. She did undergo genetic testing for her treatment over area and cancer in the past and genetic testing showed ATM [id number] of uncertain significance   2. Recent mammogram on 02/02/2017 showed: There is a lobular mass within the upper-outer left breast underlying the palpable the marker. Mass measures 2.5 x 2.1 cm on mammography. No additional concerning masses, calcifications or nonsurgical distortion identified within either breast.  Mammographic images were processed with CAD.   Targeted ultrasound is performed, showing a 2.0 x 1.8 x 1.6 cm irregular hypoechoic mass left breast 3 o'clock position 4 cm from nipple, corresponding with palpable abnormality. No left axillary adenopathy.  3. 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 ER PR positive her 2 negative   4. Patient is premenopausal.She is G5 P5 L5. Did not use birth control and did not breast feed. Menarche at the age of 31.  2. Patient underwent lumpectomy with SLNB on 03/09/17 which showed: DIAGNOSIS:  A. LEFT BREAST MASS, 3:00;  EXCISION:  - INVASIVE MAMMARY CARCINOMA OF NO SPECIAL TYPE.  - HISTOLOGIC CHANGES CONSISTENT WITH BIOPSY SITE.  - ATYPICAL LOBULAR HYPERPLASIA.  - THE SURGICAL MARGINS ARE NEGATIVE.   B. LEFT BREAST, MEDIAL MARGIN; REEXCISION:  - NEGATIVE FOR INVASIVE CARCINOMA.  - ATYPICAL LOBULAR HYPERPLASIA.  - FIBROCYSTIC CHANGE.   C. SENTINEL LYMPH NODE, LEFT AXILLA; EXCISION:  - NO TUMOR SEEN IN ONE LYMPH NODE (0/1).  - SEE SUMMARY BELOW.   Surgical Pathology Cancer Case Summary   INVASIVE CARCINOMA OF THE BREAST  Procedure: Lumpectomy  Specimen Laterality: Left  Histologic Type: Invasive carcinoma of no special type  Histologic Grade (Nottingham Histologic Score)       Glandular (Acinar)/Tubular Differentiation: 3       Nuclear Pleomorphism: 2       Mitotic Rate: 2       Overall Grade: 2  Tumor Size: 23 mm  Ductal Carcinoma in Situ (DCIS): Not identified  Lymphovascular Invasion: Not identified  Treatment Effect: Not applicable  Margins:       Invasive carcinoma: Uninvolved by invasive carcinoma         Distance from closest margin: 3 mm from posterior margin   Regional Lymph nodes:    Total # lymph nodes examined: 1    # Sentinel lymph nodes examined: 1    # Lymph nodes with macrometastasis (>2.0 mm): 0    # Lymph nodes with isolated tumor cells (<0.2 mm): 0    # Lymph nodes with micrometastasis (>0.2 mm and <2.0 mm): 0   Pathologic Stage Classification (pTNM, AJCC 7th Edition)    TNM Descriptors: Not applicable    pTNM: pT2  pN0(sn)   6. Oncotype testing showed intermediate score of 21. Risks and benefits of chemotherapy as well results of ASCO 2018 TAILORx plenary data discussed. Adjuvant chemotherapy not given after mutual discussion. She completed adjuvant radiation. Baseline bone density scan normal  7. Patient started on lupron + AI in sept 2018. She had significant headaches after a few weeks of lupron and AI.  AI was stopped for 2 weeks but headaches did not resolve. Patient was therefore switched to tamoxifen   Interval history-patient has not had any menstrual cycles for almost a year on tamoxifen.  She is continues to tolerate tamoxifen well without any significant side effects.  Denies any specific complaints today.  Her appetite is good and her weight has stabilized after her initial.  Of weight loss following the keto diet.  ECOG PS- 0 Pain scale- 0  Review of systems- Review of Systems  Constitutional: Negative for chills, fever, malaise/fatigue and weight loss.  HENT: Negative for congestion, ear discharge and nosebleeds.   Eyes: Negative for blurred vision.  Respiratory: Negative for cough, hemoptysis, sputum production, shortness of breath and wheezing.   Cardiovascular: Negative for chest pain, palpitations, orthopnea and claudication.  Gastrointestinal: Negative for abdominal pain, blood in stool, constipation, diarrhea, heartburn, melena, nausea and vomiting.  Genitourinary: Negative for dysuria, flank pain, frequency, hematuria and urgency.  Musculoskeletal: Negative for back pain, joint pain and myalgias.  Skin: Negative for rash.  Neurological: Negative for dizziness, tingling, focal weakness, seizures, weakness and headaches.  Endo/Heme/Allergies: Does not bruise/bleed easily.  Psychiatric/Behavioral: Negative for depression and suicidal ideas. The patient does not have insomnia.       Allergies  Allergen Reactions  . Lovastatin Other (See Comments)    Chest pain  . Motrin [Ibuprofen] Other (See Comments)    Hx of renal failure  . Promethazine Other (See Comments)    Excessive sleepiness.     Past Medical History:  Diagnosis Date  . Anxiety   . Breast cancer ([place]) 2018   invasive mammary and low grade DCIS  . Chronic kidney disease 2009   H/O ACUTE KIDNEY FAILURE WITH RHABDOMYOLOSIS  . GERD (gastroesophageal reflux disease)   . History of kidney stones   .  Hypertension   . Personal history of radiation therapy 2018   left breast cancer  . Rhabdomyolysis    2009  . UTI (urinary tract infection)      Past Surgical History:  Procedure Laterality Date  . 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 BREAST LUMPECTOMY Left 2018  . CHOLECYSTECTOMY    . COLONOSCOPY     4/17  . MASTECTOMY, PARTIAL Left 03/09/2017   Procedure: MASTECTOMY PARTIAL;  Surgeon: Leonie Green, MD;  Location: [facility] ORS;  Service: General;  Laterality: Left;  . MS MAMMO SCREENING     4/18  . OTHER SURGICAL HISTORY     pap 4/18  . SENTINEL NODE BIOPSY Left 03/09/2017   Procedure: SENTINEL NODE BIOPSY;  Surgeon: Leonie Green, MD;  Location: [facility] ORS;  Service: General;  Laterality: Left;    Social History   Socioeconomic History  . Marital status: Married    Spouse name: Not on file  . Number of children: Not on file  . Years of education: Not on file  . Highest education level: Not on file  Occupational History  . Not on file  Social Needs  . Financial resource strain: Not on file  .  Food insecurity:    Worry: Not on file    Inability: Not on file  . Transportation needs:    Medical: Not on file    Non-medical: Not on file  Tobacco Use  . Smoking status: Never Smoker  . Smokeless tobacco: Never Used  Substance and Sexual Activity  . Alcohol use: Yes    Comment: socially  . Drug use: No  . Sexual activity: Yes  Lifestyle  . Physical activity:    Days per week: Not on file    Minutes per session: Not on file  . Stress: Not on file  Relationships  . Social connections:    Talks on phone: Not on file    Gets together: Not on file    Attends religious service: Not on file    Active member of club or organization: Not on file    Attends meetings of clubs or organizations: Not on file    Relationship status: Not on file  . Intimate partner violence:    Fear of current or ex partner: Not on file     Emotionally abused: Not on file    Physically abused: Not on file    Forced sexual activity: Not on file  Other Topics Concern  . Not on file  Social History Narrative  . Not on file    Family History  Problem Relation Age of Onset  . Colon cancer Mother 92  . Breast cancer Maternal Aunt        3 materal aunts all 30's  . Breast cancer Maternal Aunt   . Breast cancer Maternal Aunt      Current Outpatient Medications:  .  acetaminophen (TYLENOL) 500 MG tablet, Take 1,000 mg by mouth every 6 (six) hours as needed., Disp: , Rfl:  .  amLODipine (NORVASC) 10 MG tablet, Take 10 mg by mouth every morning. , Disp: , Rfl:  .  RABEprazole (ACIPHEX) 20 MG tablet, Take 20 mg by mouth daily., Disp: , Rfl:  .  sertraline (ZOLOFT) 50 MG tablet, Take 50 mg by mouth every morning. , Disp: , Rfl:  .  tamoxifen (NOLVADEX) 20 MG tablet, TAKE 1 TABLET BY MOUTH EVERY DAY, Disp: 90 tablet, Rfl: 1  Physical exam:  Vitals:   11/19/18 1457  BP: 128/78  Pulse: 80  Resp: 16  Temp: 98.3 F (36.8 C)  TempSrc: Tympanic  Weight: 126 lb 2 oz (57.2 kg)   Physical Exam Constitutional:      General: She is not in acute distress. HENT:     Head: Normocephalic and atraumatic.  Eyes:     Pupils: Pupils are equal, round, and reactive to light.  Neck:     Musculoskeletal: Normal range of motion.  Cardiovascular:     Rate and Rhythm: Normal rate and regular rhythm.     Heart sounds: Normal heart sounds.  Pulmonary:     Effort: Pulmonary effort is normal.     Breath sounds: Normal breath sounds.  Abdominal:     General: Bowel sounds are normal.     Palpations: Abdomen is soft.  Skin:    General: Skin is warm and dry.  Neurological:     Mental Status: She is alert and oriented to person, place, and time.     Breast exam was performed in seated and lying down position. Patient is status post left lumpectomy with a well-healed surgical scar. No evidence of any palpable masses. No evidence of axillary  adenopathy. No evidence of  any palpable masses or lumps in the right breast. No evidence of right axillary adenopathy  CMP Latest Ref Rng & Units 11/19/2018  Glucose 70 - 99 mg/dL 120(H)  BUN 6 - 20 mg/dL 19  Creatinine 0.44 - 1.00 mg/dL 0.68  Sodium 135 - 145 mmol/L 141  Potassium 3.5 - 5.1 mmol/L 4.0  Chloride 98 - 111 mmol/L 108  CO2 22 - 32 mmol/L 29  Calcium 8.9 - 10.3 mg/dL 9.2  Total Protein 6.5 - 8.1 g/dL 7.3  Total Bilirubin 0.3 - 1.2 mg/dL 0.4  Alkaline Phos 38 - 126 U/L 60  AST 15 - 41 U/L 84(H)  ALT 0 - 44 U/L 74(H)   CBC Latest Ref Rng & Units 05/21/2018  WBC 3.6 - 11.0 K/uL 4.6  Hemoglobin 12.0 - 16.0 g/dL 12.1  Hematocrit 35.0 - 47.0 % 35.7  Platelets 150 - 440 K/uL 177      Assessment and plan- Patient is a 53 y.o. female stage IA pT2 N0 M0 invasive mammary carcinoma of the left breast ER/PR positive HER-2/neu negative s/p lumpectomy and adjuvant radiationcurrently on tamoxifen.  She is here for routine follow-up of breast cancer on tamoxifen  Since patient has not had menstrual cycles for almost a year on tamoxifen I was planning to switch her to AI based on hormone levels.  However her hormone levels are still premenopausal and she will continue to remain on tamoxifen at this time along with calcium and vitamin D.  She is due for repeat mammogram in June 2020 which we will schedule.  She is also is due for bone density scan at that time.  I will see him back in 6 months time with a mammogram and bone density scan prior  Patient noted to have mildly abnormal LFTs on today's labs with AST of 84 and ALT of 74.  I will repeat her CMP in 1 month's time   Visit Diagnosis 1. Malignant neoplasm of upper-outer quadrant of left breast in female, estrogen receptor positive ([place])   2. Osteoporosis screening   3. Long-term current use of tamoxifen      Dr. Randa Evens, MD, MPH [facility] [phone] 11/22/2018 8:23 AM

## 2018-12-18 ENCOUNTER — Other Ambulatory Visit: Payer: Self-pay

## 2018-12-18 ENCOUNTER — Inpatient Hospital Stay: Payer: BLUE CROSS/BLUE SHIELD | Attending: Oncology

## 2018-12-18 DIAGNOSIS — C50412 Malignant neoplasm of upper-outer quadrant of left female breast: Secondary | ICD-10-CM | POA: Diagnosis not present

## 2018-12-18 DIAGNOSIS — Z170 Estrogen receptor positive status [ER+]: Secondary | ICD-10-CM | POA: Diagnosis not present

## 2018-12-18 LAB — COMPREHENSIVE METABOLIC PANEL
ALT: 19 U/L (ref 0–44)
AST: 21 U/L (ref 15–41)
Albumin: 4.6 g/dL (ref 3.5–5.0)
Alkaline Phosphatase: 41 U/L (ref 38–126)
Anion gap: 6 (ref 5–15)
BUN: 20 mg/dL (ref 6–20)
CHLORIDE: 109 mmol/L (ref 98–111)
CO2: 25 mmol/L (ref 22–32)
Calcium: 9.4 mg/dL (ref 8.9–10.3)
Creatinine, Ser: 0.84 mg/dL (ref 0.44–1.00)
GFR calc Af Amer: >60 mL/min (ref >60)
Glucose, Bld: 104 mg/dL — ABNORMAL HIGH (ref 70–99)
Potassium: 3.9 mmol/L (ref 3.5–5.1)
Sodium: 140 mmol/L (ref 135–145)
Total Bilirubin: 0.6 mg/dL (ref 0.3–1.2)
Total Protein: 7.3 g/dL (ref 6.5–8.1)

## 2018-12-30 ENCOUNTER — Other Ambulatory Visit: Payer: Self-pay | Admitting: Oncology

## 2019-03-31 ENCOUNTER — Ambulatory Visit
Admission: RE | Admit: 2019-03-31 | Discharge: 2019-03-31 | Disposition: A | Discharge status: Home / Self Care | Payer: BC Managed Care – PPO | Source: Ambulatory Visit | Attending: Oncology | Admitting: Oncology

## 2019-03-31 ENCOUNTER — Other Ambulatory Visit: Payer: Self-pay

## 2019-03-31 DIAGNOSIS — C50412 Malignant neoplasm of upper-outer quadrant of left female breast: Secondary | ICD-10-CM

## 2019-03-31 DIAGNOSIS — Z13820 Encounter for screening for osteoporosis: Secondary | ICD-10-CM | POA: Insufficient documentation

## 2019-03-31 DIAGNOSIS — Z170 Estrogen receptor positive status [ER+]: Secondary | ICD-10-CM

## 2019-05-29 ENCOUNTER — Other Ambulatory Visit: Payer: Self-pay

## 2019-05-30 ENCOUNTER — Encounter: Payer: Self-pay | Admitting: Oncology

## 2019-05-30 ENCOUNTER — Other Ambulatory Visit: Payer: Self-pay

## 2019-05-30 ENCOUNTER — Inpatient Hospital Stay: Payer: BC Managed Care – PPO | Attending: Oncology | Admitting: Oncology

## 2019-05-30 VITALS — BP 130/76 | HR 82 | Temp 97.8°F | Resp 18 | Wt 123.3 lb

## 2019-05-30 DIAGNOSIS — Z08 Encounter for follow-up examination after completed treatment for malignant neoplasm: Secondary | ICD-10-CM

## 2019-05-30 DIAGNOSIS — Z79810 Long term (current) use of selective estrogen receptor modulators (SERMs): Secondary | ICD-10-CM | POA: Diagnosis not present

## 2019-05-30 DIAGNOSIS — Z886 Allergy status to analgesic agent status: Secondary | ICD-10-CM | POA: Diagnosis not present

## 2019-05-30 DIAGNOSIS — Z803 Family history of malignant neoplasm of breast: Secondary | ICD-10-CM | POA: Diagnosis not present

## 2019-05-30 DIAGNOSIS — Z853 Personal history of malignant neoplasm of breast: Secondary | ICD-10-CM

## 2019-05-30 DIAGNOSIS — C50412 Malignant neoplasm of upper-outer quadrant of left female breast: Secondary | ICD-10-CM | POA: Insufficient documentation

## 2019-05-30 DIAGNOSIS — Z87442 Personal history of urinary calculi: Secondary | ICD-10-CM | POA: Diagnosis not present

## 2019-05-30 DIAGNOSIS — Z170 Estrogen receptor positive status [ER+]: Secondary | ICD-10-CM | POA: Insufficient documentation

## 2019-05-30 DIAGNOSIS — Z5181 Encounter for therapeutic drug level monitoring: Secondary | ICD-10-CM

## 2019-05-30 DIAGNOSIS — Z79899 Other long term (current) drug therapy: Secondary | ICD-10-CM | POA: Diagnosis not present

## 2019-05-30 DIAGNOSIS — N6489 Other specified disorders of breast: Secondary | ICD-10-CM | POA: Insufficient documentation

## 2019-05-30 NOTE — Progress Notes (Signed)
Pt here for follow up. Offers no complaints.

## 2019-06-02 NOTE — Progress Notes (Signed)
Hematology/Oncology Consult note [facility]  Telephone:([phone] [phone]  Patient Care Team: Alanson Aly, [place] as PCP - General (Family Medicine)   Name of the patient: Dana Harrell  [id number]  [birth year]   Date of visit: 06/02/19  Diagnosis-  stage IA pT2 N0 M0 invasive mammary carcinoma of the left breast ER/PR positive HER-2/neu negative  Chief complaint/ Reason for visit-routine follow-up of breast cancer on tamoxifen  Heme/Onc history: 1. Patient is a 53 year old premenopausal female who noticed a palpable left breast mass in April 2018. Her prior mammogram was in 2015 which was normal. She has not had any personal history of breast cancer or prior breast biopsies. She does have a strong family history of breast cancer in her maternal aunts. She did undergo genetic testing for her treatment over area and cancer in the past and genetic testing showed ATM [id number] of uncertain significance   2. Recent mammogram on 02/02/2017 showed: There is a lobular mass within the upper-outer left breast underlying the palpable the marker. Mass measures 2.5 x 2.1 cm on mammography. No additional concerning masses, calcifications or nonsurgical distortion identified within either breast.  Mammographic images were processed with CAD.   Targeted ultrasound is performed, showing a 2.0 x 1.8 x 1.6 cm irregular hypoechoic mass left breast 3 o'clock position 4 cm from nipple, corresponding with palpable abnormality. No left axillary adenopathy.  3. 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 ER PR positive her 2 negative   4. Patient is premenopausal.She is G5 P5 L5. Did not use birth control and did not breast feed. Menarche at the age of 18.  27. Patient underwent lumpectomy with SLNB on 03/09/17 which showed: DIAGNOSIS:  A. LEFT BREAST MASS, 3:00;  EXCISION:  - INVASIVE MAMMARY CARCINOMA OF NO SPECIAL TYPE.  - HISTOLOGIC CHANGES CONSISTENT WITH BIOPSY SITE.  - ATYPICAL LOBULAR HYPERPLASIA.  - THE SURGICAL MARGINS ARE NEGATIVE.   B. LEFT BREAST, MEDIAL MARGIN; REEXCISION:  - NEGATIVE FOR INVASIVE CARCINOMA.  - ATYPICAL LOBULAR HYPERPLASIA.  - FIBROCYSTIC CHANGE.   C. SENTINEL LYMPH NODE, LEFT AXILLA; EXCISION:  - NO TUMOR SEEN IN ONE LYMPH NODE (0/1).  - SEE SUMMARY BELOW.   Surgical Pathology Cancer Case Summary   INVASIVE CARCINOMA OF THE BREAST  Procedure: Lumpectomy  Specimen Laterality: Left  Histologic Type: Invasive carcinoma of no special type  Histologic Grade (Nottingham Histologic Score)       Glandular (Acinar)/Tubular Differentiation: 3       Nuclear Pleomorphism: 2       Mitotic Rate: 2       Overall Grade: 2  Tumor Size: 23 mm  Ductal Carcinoma in Situ (DCIS): Not identified  Lymphovascular Invasion: Not identified  Treatment Effect: Not applicable  Margins:       Invasive carcinoma: Uninvolved by invasive carcinoma         Distance from closest margin: 3 mm from posterior margin   Regional Lymph nodes:    Total # lymph nodes examined: 1    # Sentinel lymph nodes examined: 1    # Lymph nodes with macrometastasis (>2.0 mm): 0    # Lymph nodes with isolated tumor cells (<0.2 mm): 0    # Lymph nodes with micrometastasis (>0.2 mm and <2.0 mm): 0   Pathologic Stage Classification (pTNM, AJCC 7th Edition)    TNM Descriptors: Not applicable    pTNM:  pT2 pN0(sn)   6. Oncotype testing showed intermediate score of 21. Risks and benefits of chemotherapy as well results of [facility] 2018 TAILORx plenary data discussed. Adjuvant chemotherapy not given after mutual discussion. She completed adjuvant radiation. Baseline bone density scan normal  7. Patient started on lupron + AI in sept 2018. She had significant headaches after a few weeks of lupron and AI.  AI was stopped for 2 weeks but headaches did not resolve. Patient was therefore switched to tamoxifen.  Patient has not had menstrual cycles for about a year on tamoxifen but estradiol levels are still in the premenopausal range   Interval history-overall she feels well and denies any complaints at this time.  Appetite and weight have remained stable after initial weight loss following her keto diet.  Denies any new aches or pains anywhere  ECOG PS- 0 Pain scale- 0   Review of systems- Review of Systems  Constitutional: Negative for chills, fever, malaise/fatigue and weight loss.  HENT: Negative for congestion, ear discharge and nosebleeds.   Eyes: Negative for blurred vision.  Respiratory: Negative for cough, hemoptysis, sputum production, shortness of breath and wheezing.   Cardiovascular: Negative for chest pain, palpitations, orthopnea and claudication.  Gastrointestinal: Negative for abdominal pain, blood in stool, constipation, diarrhea, heartburn, melena, nausea and vomiting.  Genitourinary: Negative for dysuria, flank pain, frequency, hematuria and urgency.  Musculoskeletal: Negative for back pain, joint pain and myalgias.  Skin: Negative for rash.  Neurological: Negative for dizziness, tingling, focal weakness, seizures, weakness and headaches.  Endo/Heme/Allergies: Does not bruise/bleed easily.  Psychiatric/Behavioral: Negative for depression and suicidal ideas. The patient does not have insomnia.      Allergies  Allergen Reactions   Lovastatin Other (See Comments)    Chest pain   Motrin [Ibuprofen] Other (See Comments)    Hx of renal failure   Promethazine Other (See Comments)    Excessive sleepiness.     Past Medical History:  Diagnosis Date   Anxiety    Breast cancer ([place]) 2018   invasive mammary and low grade DCIS   Chronic kidney disease 2009   H/O ACUTE KIDNEY FAILURE WITH RHABDOMYOLOSIS   GERD (gastroesophageal reflux disease)    History of kidney  stones    Hypertension    Personal history of radiation therapy 2018   left breast cancer   Rhabdomyolysis    2009   UTI (urinary tract infection)      Past Surgical History:  Procedure Laterality Date   BREAST BIOPSY Left 2018   invasive mammary carcinoma and low grade DCIS   BREAST LUMPECTOMY Left 2018   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   CHOLECYSTECTOMY     COLONOSCOPY     4/17   MASTECTOMY, PARTIAL Left 03/09/2017   Procedure: MASTECTOMY PARTIAL;  Surgeon: Leonie Green, MD;  Location: [facility] ORS;  Service: General;  Laterality: Left;   MS MAMMO SCREENING     4/18   OTHER SURGICAL HISTORY     pap 4/18   SENTINEL NODE BIOPSY Left 03/09/2017   Procedure: SENTINEL NODE BIOPSY;  Surgeon: Leonie Green, MD;  Location: [facility] ORS;  Service: General;  Laterality: Left;    Social History   Socioeconomic History   Marital status: Married    Spouse name: Not on file   Number of children: Not on file   Years of education: Not on file   Highest education level: Not on file  Occupational History   Not on  file  Social Needs   Financial resource strain: Not on file   Food insecurity    Worry: Not on file    Inability: Not on file   Transportation needs    Medical: Not on file    Non-medical: Not on file  Tobacco Use   Smoking status: Never Smoker   Smokeless tobacco: Never Used  Substance and Sexual Activity   Alcohol use: Yes    Comment: socially   Drug use: No   Sexual activity: Yes  Lifestyle   Physical activity    Days per week: Not on file    Minutes per session: Not on file   Stress: Not on file  Relationships   Social connections    Talks on phone: Not on file    Gets together: Not on file    Attends religious service: Not on file    Active member of club or organization: Not on file    Attends meetings of clubs or organizations: Not on file    Relationship status: Not on file   Intimate partner  violence    Fear of current or ex partner: Not on file    Emotionally abused: Not on file    Physically abused: Not on file    Forced sexual activity: Not on file  Other Topics Concern   Not on file  Social History Narrative   Not on file    Family History  Problem Relation Age of Onset   Colon cancer Mother 54   Breast cancer Maternal Aunt        3 materal aunts all 22's   Breast cancer Maternal Aunt    Breast cancer Maternal Aunt      Current Outpatient Medications:    acetaminophen (TYLENOL) 500 MG tablet, Take 1,000 mg by mouth every 6 (six) hours as needed., Disp: , Rfl:    amLODipine (NORVASC) 10 MG tablet, Take 10 mg by mouth every morning. , Disp: , Rfl:    RABEprazole (ACIPHEX) 20 MG tablet, Take 20 mg by mouth daily., Disp: , Rfl:    sertraline (ZOLOFT) 50 MG tablet, Take 50 mg by mouth every morning. , Disp: , Rfl:    tamoxifen (NOLVADEX) 20 MG tablet, TAKE 1 TABLET BY MOUTH EVERY DAY, Disp: 90 tablet, Rfl: 1  Physical exam:  Vitals:   05/30/19 1451  BP: 130/76  Pulse: 82  Resp: 18  Temp: 97.8 F (36.6 C)  TempSrc: Tympanic  Weight: 123 lb 4.8 oz (55.9 kg)   Physical Exam HENT:     Head: Normocephalic and atraumatic.  Eyes:     Pupils: Pupils are equal, round, and reactive to light.  Neck:     Musculoskeletal: Normal range of motion.  Cardiovascular:     Rate and Rhythm: Normal rate and regular rhythm.     Heart sounds: Normal heart sounds.  Pulmonary:     Effort: Pulmonary effort is normal.     Breath sounds: Normal breath sounds.  Abdominal:     General: Bowel sounds are normal.     Palpations: Abdomen is soft.  Skin:    General: Skin is warm and dry.  Neurological:     Mental Status: She is alert and oriented to person, place, and time.     Breast exam was performed in seated and lying down position. Patient is status post left lumpectomy with a well-healed surgical scar. No evidence of any palpable masses. No evidence of axillary  adenopathy. No evidence  of any palpable masses or lumps in the right breast. No evidence of right axillary adenopathy    CMP Latest Ref Rng & Units 12/18/2018  Glucose 70 - 99 mg/dL 104(H)  BUN 6 - 20 mg/dL 20  Creatinine 0.44 - 1.00 mg/dL 0.84  Sodium 135 - 145 mmol/L 140  Potassium 3.5 - 5.1 mmol/L 3.9  Chloride 98 - 111 mmol/L 109  CO2 22 - 32 mmol/L 25  Calcium 8.9 - 10.3 mg/dL 9.4  Total Protein 6.5 - 8.1 g/dL 7.3  Total Bilirubin 0.3 - 1.2 mg/dL 0.6  Alkaline Phos 38 - 126 U/L 41  AST 15 - 41 U/L 21  ALT 0 - 44 U/L 19   CBC Latest Ref Rng & Units 05/21/2018  WBC 3.6 - 11.0 K/uL 4.6  Hemoglobin 12.0 - 16.0 g/dL 12.1  Hematocrit 35.0 - 47.0 % 35.7  Platelets 150 - 440 K/uL 177      Assessment and plan- Patient is a 53 y.o. female  stage IA pT2 N0 M0 invasive mammary carcinoma of the left breast ER/PR positive HER-2/neu negative s/p lumpectomy and adjuvant radiationcurrently on tamoxifen.  She is here for routine follow-up of breast cancer on tamoxifen  Clinically patient is doing well and no evidence of recurrence on today's exam.  She has not had menstrual cycles for over a year on tamoxifen but the last hormone levels that were checked in February 2020 showed [facility] was high but estradiol was still in the premenopausal range. She will therefore continue tamoxifen at this time.    I will see her back in 6 months no labs.  Recent mammogram from June 2020 did not reveal any evidence of malignancy and her bone density scan was normal.  She will continue calcium and vitamin D prophylaxis.   Visit Diagnosis 1. Encounter for follow-up surveillance of breast cancer   2. Encounter for monitoring tamoxifen therapy      Dr. Randa Evens, MD, MPH [facility] at [facility] [phone] 06/02/2019 8:33 AM

## 2019-06-04 DIAGNOSIS — K219 Gastro-esophageal reflux disease without esophagitis: Secondary | ICD-10-CM | POA: Insufficient documentation

## 2019-06-05 DIAGNOSIS — E559 Vitamin D deficiency, unspecified: Secondary | ICD-10-CM | POA: Insufficient documentation

## 2019-07-09 ENCOUNTER — Ambulatory Visit: Payer: BC Managed Care – PPO | Admitting: Radiation Oncology

## 2019-07-20 ENCOUNTER — Other Ambulatory Visit: Payer: Self-pay | Admitting: Oncology

## 2019-07-23 ENCOUNTER — Other Ambulatory Visit: Payer: Self-pay

## 2019-07-24 ENCOUNTER — Ambulatory Visit
Admission: RE | Admit: 2019-07-24 | Discharge: 2019-07-24 | Disposition: A | Discharge status: Home / Self Care | Payer: BC Managed Care – PPO | Source: Ambulatory Visit | Attending: Radiation Oncology | Admitting: Radiation Oncology

## 2019-07-24 ENCOUNTER — Other Ambulatory Visit: Payer: Self-pay

## 2019-07-24 ENCOUNTER — Encounter: Payer: Self-pay | Admitting: Radiation Oncology

## 2019-07-24 DIAGNOSIS — Z923 Personal history of irradiation: Secondary | ICD-10-CM | POA: Diagnosis not present

## 2019-07-24 DIAGNOSIS — C50412 Malignant neoplasm of upper-outer quadrant of left female breast: Secondary | ICD-10-CM | POA: Diagnosis present

## 2019-07-24 DIAGNOSIS — Z170 Estrogen receptor positive status [ER+]: Secondary | ICD-10-CM | POA: Insufficient documentation

## 2019-07-24 DIAGNOSIS — Z79810 Long term (current) use of selective estrogen receptor modulators (SERMs): Secondary | ICD-10-CM | POA: Insufficient documentation

## 2019-07-24 NOTE — Progress Notes (Signed)
Radiation Oncology Follow up Note  Name: Dana Harrell   Date:   07/24/2019 MRN:  [id number] DOB: [birth year]    This 53 y.o. female presents to the clinic today for 2-year follow-up status post whole breast radiation to her left breast for stage I ER/PR positive invasive mammary carcinoma.Marland Kitchen  REFERRING PROVIDER: Alanson Aly, FNP  HPI: Patient is a 53 year old female now about 2 years having completed whole breast radiation to her left breast for a T2N0 ER/PR positive HER-2 negative invasive mammary carcinoma.  Seen today in routine follow-up she is doing well.  She specifically denies breast tenderness cough or bone pain.  Her last mammogram.  Was in June which I have reviewed was BI-RADS 2 benign.  She is currently on tamoxifen tolerating that well without side effects.  COMPLICATIONS OF TREATMENT: none  FOLLOW UP COMPLIANCE: keeps appointments   PHYSICAL EXAM:  BP (P) 132/72 (BP Location: Right Arm, Patient Position: Sitting)   Pulse (P) 84   Temp (!) (P) 97.1 F (36.2 C) (Tympanic)   Resp (P) 16   Wt (P) 125 lb 6.4 oz (56.9 kg)   BMI (P) 20.24 kg/m  Lungs are clear to A&P cardiac examination essentially unremarkable with regular rate and rhythm. No dominant mass or nodularity is noted in either breast in 2 positions examined. Incision is well-healed. No axillary or supraclavicular adenopathy is appreciated. Cosmetic result is excellent.  Well-developed well-nourished patient in NAD. HEENT reveals PERLA, EOMI, discs not visualized.  Oral cavity is clear. No oral mucosal lesions are identified. Neck is clear without evidence of cervical or supraclavicular adenopathy. Lungs are clear to A&P. Cardiac examination is essentially unremarkable with regular rate and rhythm without murmur rub or thrill. Abdomen is benign with no organomegaly or masses noted. Motor sensory and DTR levels are equal and symmetric in the upper and lower extremities. Cranial nerves II through XII are grossly  intact. Proprioception is intact. No peripheral adenopathy or edema is identified. No motor or sensory levels are noted. Crude visual fields are within normal range.  RADIOLOGY RESULTS: Mammograms reviewed compatible with the above-stated findings  PLAN: Present time patient is now 2 years out under excellent control with no evidence of disease.  I am pleased with her overall progress.  I have asked to see her back in 1 year for follow-up.  She continues on tamoxifen without side effects.  Patient knows to call with any concerns.  I would like to take this opportunity to thank you for allowing me to participate in the care of your patient.Noreene Filbert, MD

## 2019-10-13 ENCOUNTER — Ambulatory Visit: Payer: BC Managed Care – PPO | Attending: Internal Medicine

## 2019-10-13 DIAGNOSIS — Z20822 Contact with and (suspected) exposure to covid-19: Secondary | ICD-10-CM

## 2019-10-14 LAB — NOVEL CORONAVIRUS, NAA: SARS-CoV-2, NAA: NOT DETECTED

## 2019-10-20 ENCOUNTER — Ambulatory Visit: Payer: BC Managed Care – PPO | Attending: Internal Medicine

## 2019-10-20 DIAGNOSIS — Z20822 Contact with and (suspected) exposure to covid-19: Secondary | ICD-10-CM

## 2019-10-21 LAB — NOVEL CORONAVIRUS, NAA: SARS-CoV-2, NAA: NOT DETECTED

## 2019-12-01 ENCOUNTER — Other Ambulatory Visit: Payer: Self-pay

## 2019-12-01 ENCOUNTER — Inpatient Hospital Stay: Payer: BC Managed Care – PPO | Attending: Oncology | Admitting: Oncology

## 2019-12-01 ENCOUNTER — Encounter: Payer: Self-pay | Admitting: Oncology

## 2019-12-01 DIAGNOSIS — Z79810 Long term (current) use of selective estrogen receptor modulators (SERMs): Secondary | ICD-10-CM | POA: Diagnosis not present

## 2019-12-01 DIAGNOSIS — Z08 Encounter for follow-up examination after completed treatment for malignant neoplasm: Secondary | ICD-10-CM | POA: Diagnosis not present

## 2019-12-01 DIAGNOSIS — Z853 Personal history of malignant neoplasm of breast: Secondary | ICD-10-CM | POA: Diagnosis not present

## 2019-12-01 DIAGNOSIS — Z5181 Encounter for therapeutic drug level monitoring: Secondary | ICD-10-CM

## 2019-12-01 NOTE — Progress Notes (Signed)
Patient stated that she had been doing well with no concerns. Patient had her last mammogram on 03/31/2019. Patient stated that she will see Dr. Ouida Sills on 03/16/2020 and he is the physician to order her mammograms. Patient's last bone density was done on 03/27/2019. Patient denied any pain, nipple discharge, skin discoloration at this time.

## 2019-12-04 NOTE — Progress Notes (Signed)
I connected with Dana Harrell on 12/04/19 at  1:15 PM EST by video enabled telemedicine visit and verified that I am speaking with the correct person using two identifiers.   I discussed the limitations, risks, security and privacy concerns of performing an evaluation and management service by telemedicine and the availability of in-person appointments. I also discussed with the patient that there may be a patient responsible charge related to this service. The patient expressed understanding and agreed to proceed.  Other persons participating in the visit and their role in the encounter:  none  Patient's location:  home Provider's location:  work  Risk analyst Complaint:  Routine f/u of breast cancer  History of present illness: 1. Patient is a 54 year old premenopausal female who noticed a palpable left breast mass in April 2018. Her prior mammogram was in 2015 which was normal. She has not had any personal history of breast cancer or prior breast biopsies. She does have a strong family history of breast cancer in her maternal aunts. She did undergo genetic testing for her treatment over area and cancer in the past and genetic testing showed ATM [id number] of uncertain significance   2. Recent mammogram on 02/02/2017 showed: There is a lobular mass within the upper-outer left breast underlying the palpable the marker. Mass measures 2.5 x 2.1 cm on mammography. No additional concerning masses, calcifications or nonsurgical distortion identified within either breast.  Mammographic images were processed with CAD.   Targeted ultrasound is performed, showing a 2.0 x 1.8 x 1.6 cm irregular hypoechoic mass left breast 3 o'clock position 4 cm from nipple, corresponding with palpable abnormality. No left axillary adenopathy.  3. 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 ER PR positive her 2 negative   4.  Patient is premenopausal.She is G5 P5 L5. Did not use birth control and did not breast feed. Menarche at the age of 54.  53. Patient underwent lumpectomy with SLNB on 03/09/17 which showed: DIAGNOSIS:  A. LEFT BREAST MASS, 3:00; EXCISION:  - INVASIVE MAMMARY CARCINOMA OF NO SPECIAL TYPE.  - HISTOLOGIC CHANGES CONSISTENT WITH BIOPSY SITE.  - ATYPICAL LOBULAR HYPERPLASIA.  - THE SURGICAL MARGINS ARE NEGATIVE.   B. LEFT BREAST, MEDIAL MARGIN; REEXCISION:  - NEGATIVE FOR INVASIVE CARCINOMA.  - ATYPICAL LOBULAR HYPERPLASIA.  - FIBROCYSTIC CHANGE.   C. SENTINEL LYMPH NODE, LEFT AXILLA; EXCISION:  - NO TUMOR SEEN IN ONE LYMPH NODE (0/1).  - SEE SUMMARY BELOW.   Surgical Pathology Cancer Case Summary   INVASIVE CARCINOMA OF THE BREAST  Procedure: Lumpectomy  Specimen Laterality: Left  Histologic Type: Invasive carcinoma of no special type  Histologic Grade (Nottingham Histologic Score)       Glandular (Acinar)/Tubular Differentiation: 3       Nuclear Pleomorphism: 2       Mitotic Rate: 2       Overall Grade: 2  Tumor Size: 23 mm  Ductal Carcinoma in Situ (DCIS): Not identified  Lymphovascular Invasion: Not identified  Treatment Effect: Not applicable  Margins:       Invasive carcinoma: Uninvolved by invasive carcinoma         Distance from closest margin: 3 mm from posterior margin   Regional Lymph nodes:    Total # lymph nodes examined: 1    # Sentinel lymph nodes examined: 1    # Lymph nodes with macrometastasis (>2.0 mm): 0    # Lymph nodes with  isolated tumor cells (<0.2 mm): 0    # Lymph nodes with micrometastasis (>0.2 mm and <2.0 mm): 0   Pathologic Stage Classification (pTNM, AJCC 7th Edition)    TNM Descriptors: Not applicable    pTNM: pT2 pN0(sn)   6. Oncotype testing showed intermediate score of 21. Risks and benefits of chemotherapy as well results of ASCO 2018 TAILORx plenary data discussed. Adjuvant  chemotherapy not given after mutual discussion. She completed adjuvant radiation. Baseline bone density scan normal  7. Patient started on lupron + AI in sept 2018. She had significant headaches after a few weeks of lupron and AI. AI was stopped for 2 weeks but headaches did not resolve. Patient was therefore switched to tamoxifen.  Patient has not had menstrual cycles for about a year on tamoxifen but estradiol levels are still in the premenopausal range    Interval history she has not had return of menstrual cycles for over a year now. Tolerating tamoxifen well without side effects   Review of Systems  Constitutional: Negative for chills, fever, malaise/fatigue and weight loss.  HENT: Negative for congestion, ear discharge and nosebleeds.   Eyes: Negative for blurred vision.  Respiratory: Negative for cough, hemoptysis, sputum production, shortness of breath and wheezing.   Cardiovascular: Negative for chest pain, palpitations, orthopnea and claudication.  Gastrointestinal: Negative for abdominal pain, blood in stool, constipation, diarrhea, heartburn, melena, nausea and vomiting.  Genitourinary: Negative for dysuria, flank pain, frequency, hematuria and urgency.  Musculoskeletal: Negative for back pain, joint pain and myalgias.  Skin: Negative for rash.  Neurological: Negative for dizziness, tingling, focal weakness, seizures, weakness and headaches.  Endo/Heme/Allergies: Does not bruise/bleed easily.  Psychiatric/Behavioral: Negative for depression and suicidal ideas. The patient does not have insomnia.     Allergies  Allergen Reactions  . Lovastatin Other (See Comments)    Chest pain  . Motrin [Ibuprofen] Other (See Comments)    Hx of renal failure  . Promethazine Other (See Comments)    Excessive sleepiness.    Past Medical History:  Diagnosis Date  . Anxiety   . Breast cancer ([place]) 2018   invasive mammary and low grade DCIS  . Chronic kidney disease 2009   H/O ACUTE  KIDNEY FAILURE WITH RHABDOMYOLOSIS  . GERD (gastroesophageal reflux disease)   . History of kidney stones   . Hypertension   . Personal history of radiation therapy 2018   left breast cancer  . Rhabdomyolysis    2009  . UTI (urinary tract infection)     Past Surgical History:  Procedure Laterality Date  . 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 CHOLECYSTECTOMY    . COLONOSCOPY     4/17  . MASTECTOMY, PARTIAL Left 03/09/2017   Procedure: MASTECTOMY PARTIAL;  Surgeon: Leonie Green, MD;  Location: [facility] ORS;  Service: General;  Laterality: Left;  . MS MAMMO SCREENING     4/18  . OTHER SURGICAL HISTORY     pap 4/18  . SENTINEL NODE BIOPSY Left 03/09/2017   Procedure: SENTINEL NODE BIOPSY;  Surgeon: Leonie Green, MD;  Location: [facility] ORS;  Service: General;  Laterality: Left;    Social History   Socioeconomic History  . Marital status: Married    Spouse name: Not on file  . Number of children: Not on file  . Years of education: Not on file  .  Highest education level: Not on file  Occupational History  . Not on file  Tobacco Use  . Smoking status: Never Smoker  . Smokeless tobacco: Never Used  Substance and Sexual Activity  . Alcohol use: Yes    Comment: socially  . Drug use: No  . Sexual activity: Yes  Other Topics Concern  . Not on file  Social History Narrative  . Not on file   Social Determinants of Health   Financial Resource Strain:   . Difficulty of Paying Living Expenses: Not on file  Food Insecurity:   . Worried About Charity fundraiser in the Last Year: Not on file  . Ran Out of Food in the Last Year: Not on file  Transportation Needs:   . Lack of Transportation (Medical): Not on file  . Lack of Transportation (Non-Medical): Not on file  Physical Activity:   . Days of Exercise per Week: Not on file  . Minutes of  Exercise per Session: Not on file  Stress:   . Feeling of Stress : Not on file  Social Connections:   . Frequency of Communication with Friends and Family: Not on file  . Frequency of Social Gatherings with Friends and Family: Not on file  . Attends Religious Services: Not on file  . Active Member of Clubs or Organizations: Not on file  . Attends Archivist Meetings: Not on file  . Marital Status: Not on file  Intimate Partner Violence:   . Fear of Current or Ex-Partner: Not on file  . Emotionally Abused: Not on file  . Physically Abused: Not on file  . Sexually Abused: Not on file    Family History  Problem Relation Age of Onset  . Colon cancer Mother 57  . Breast cancer Maternal Aunt        3 materal aunts all 58's  . Breast cancer Maternal Aunt   . Breast cancer Maternal Aunt      Current Outpatient Medications:  .  amLODipine (NORVASC) 10 MG tablet, Take 10 mg by mouth every morning. , Disp: , Rfl:  .  RABEprazole (ACIPHEX) 20 MG tablet, Take 20 mg by mouth daily., Disp: , Rfl:  .  sertraline (ZOLOFT) 50 MG tablet, Take 50 mg by mouth every morning. , Disp: , Rfl:  .  tamoxifen (NOLVADEX) 20 MG tablet, TAKE 1 TABLET BY MOUTH EVERY DAY, Disp: 90 tablet, Rfl: 1 .  acetaminophen (TYLENOL) 500 MG tablet, Take 1,000 mg by mouth every 6 (six) hours as needed., Disp: , Rfl:   No results found.  No images are attached to the encounter.   CMP Latest Ref Rng & Units 12/18/2018  Glucose 70 - 99 mg/dL 104(H)  BUN 6 - 20 mg/dL 20  Creatinine 0.44 - 1.00 mg/dL 0.84  Sodium 135 - 145 mmol/L 140  Potassium 3.5 - 5.1 mmol/L 3.9  Chloride 98 - 111 mmol/L 109  CO2 22 - 32 mmol/L 25  Calcium 8.9 - 10.3 mg/dL 9.4  Total Protein 6.5 - 8.1 g/dL 7.3  Total Bilirubin 0.3 - 1.2 mg/dL 0.6  Alkaline Phos 38 - 126 U/L 41  AST 15 - 41 U/L 21  ALT 0 - 44 U/L 19   CBC Latest Ref Rng & Units 05/21/2018  WBC 3.6 - 11.0 K/uL 4.6  Hemoglobin 12.0 - 16.0 g/dL 12.1  Hematocrit 35.0 -  47.0 % 35.7  Platelets 150 - 440 K/uL 177     Observation/objective:appears in no acute  distress oevr video visit today. Breathing is non labored  Assessment and plan:stage IA pT2 N0 M0 invasive mammary carcinoma of the left breast ER/PR positive HER-2/neu negative s/p lumpectomy and adjuvant radiationcurrently on tamoxifen. This is a routine f/u of breast cancer on tamoxifen  Clinically pateint is doing well. No concerning symptoms of recurrence. Tolerating tamoxifen well. I would like to switch her to arimidex if possible as she has not had return of menstrual cycles. I will check FSH, estradiol in the next week and if levels are conclusively post menopausal, switch her to arimidex.   mammogram to be scheduled in June 2021.  Follow-up instructions:I will see her in 6 months with cbc with diff and cmp  I discussed the assessment and treatment plan with the patient. The patient was provided an opportunity to ask questions and all were answered. The patient agreed with the plan and demonstrated an understanding of the instructions.   The patient was advised to call back or seek an in-person evaluation if the symptoms worsen or if the condition fails to improve as anticipated.  Visit Diagnosis: 1. Encounter for monitoring tamoxifen therapy   2. Encounter for follow-up surveillance of breast cancer     Dr. Randa Evens, MD, MPH [facility] Tel- [phone] 12/04/2019 8:20 AM

## 2019-12-08 ENCOUNTER — Other Ambulatory Visit: Payer: Self-pay

## 2019-12-08 ENCOUNTER — Inpatient Hospital Stay: Payer: BC Managed Care – PPO | Attending: Oncology

## 2019-12-08 DIAGNOSIS — Z79810 Long term (current) use of selective estrogen receptor modulators (SERMs): Secondary | ICD-10-CM | POA: Diagnosis present

## 2019-12-08 DIAGNOSIS — Z5181 Encounter for therapeutic drug level monitoring: Secondary | ICD-10-CM | POA: Diagnosis not present

## 2019-12-09 ENCOUNTER — Other Ambulatory Visit: Payer: Self-pay

## 2019-12-09 ENCOUNTER — Encounter: Payer: Self-pay | Admitting: Oncology

## 2019-12-09 DIAGNOSIS — Z170 Estrogen receptor positive status [ER+]: Secondary | ICD-10-CM

## 2019-12-09 DIAGNOSIS — C50412 Malignant neoplasm of upper-outer quadrant of left female breast: Secondary | ICD-10-CM

## 2019-12-09 LAB — ESTRADIOL: Estradiol: <5 pg/mL

## 2019-12-09 LAB — FOLLICLE STIMULATING HORMONE: FSH: 66.5 m[IU]/mL

## 2019-12-09 MED ORDER — ANASTROZOLE 1 MG PO TABS: 1.0000 mg | ORAL_TABLET | Freq: Every day | ORAL | 3 refills | Status: DC

## 2019-12-09 NOTE — Telephone Encounter (Signed)
yes

## 2020-03-31 ENCOUNTER — Ambulatory Visit
Admission: RE | Admit: 2020-03-31 | Discharge: 2020-03-31 | Disposition: A | Discharge status: Home / Self Care | Payer: BC Managed Care – PPO | Source: Ambulatory Visit | Attending: Oncology | Admitting: Oncology

## 2020-03-31 DIAGNOSIS — Z79810 Long term (current) use of selective estrogen receptor modulators (SERMs): Secondary | ICD-10-CM | POA: Insufficient documentation

## 2020-03-31 DIAGNOSIS — Z5181 Encounter for therapeutic drug level monitoring: Secondary | ICD-10-CM | POA: Diagnosis not present

## 2020-04-08 ENCOUNTER — Telehealth: Payer: Self-pay | Admitting: Oncology

## 2020-04-08 NOTE — Telephone Encounter (Signed)
[facility] appt with [facility] on 05-31-20. MD will not be in the office that day. Writer phoned patient on this date and rescheduled appts for 06-01-20.

## 2020-04-22 ENCOUNTER — Encounter: Payer: Self-pay | Admitting: Oncology

## 2020-04-28 ENCOUNTER — Other Ambulatory Visit: Payer: Self-pay | Admitting: *Deleted

## 2020-04-28 MED ORDER — TAMOXIFEN CITRATE 20 MG PO TABS: 20.0000 mg | ORAL_TABLET | Freq: Every day | ORAL | 2 refills | Status: DC

## 2020-05-31 ENCOUNTER — Ambulatory Visit: Payer: BC Managed Care – PPO | Admitting: Oncology

## 2020-05-31 ENCOUNTER — Other Ambulatory Visit: Payer: BC Managed Care – PPO

## 2020-06-01 ENCOUNTER — Inpatient Hospital Stay: Payer: BC Managed Care – PPO | Admitting: Oncology

## 2020-06-01 ENCOUNTER — Inpatient Hospital Stay: Payer: BC Managed Care – PPO

## 2020-06-18 ENCOUNTER — Inpatient Hospital Stay: Payer: BC Managed Care – PPO | Admitting: Oncology

## 2020-06-18 ENCOUNTER — Inpatient Hospital Stay: Payer: BC Managed Care – PPO | Attending: Oncology

## 2020-06-18 ENCOUNTER — Encounter: Payer: Self-pay | Admitting: Oncology

## 2020-06-18 ENCOUNTER — Other Ambulatory Visit: Payer: Self-pay

## 2020-06-18 VITALS — BP 131/77 | HR 86 | Temp 96.7°F | Resp 16 | Wt 132.0 lb

## 2020-06-18 DIAGNOSIS — Z87442 Personal history of urinary calculi: Secondary | ICD-10-CM | POA: Diagnosis not present

## 2020-06-18 DIAGNOSIS — Z170 Estrogen receptor positive status [ER+]: Secondary | ICD-10-CM | POA: Diagnosis not present

## 2020-06-18 DIAGNOSIS — Z800 Family history of malignant neoplasm of digestive organs: Secondary | ICD-10-CM | POA: Diagnosis not present

## 2020-06-18 DIAGNOSIS — Z853 Personal history of malignant neoplasm of breast: Secondary | ICD-10-CM | POA: Diagnosis not present

## 2020-06-18 DIAGNOSIS — Z79810 Long term (current) use of selective estrogen receptor modulators (SERMs): Secondary | ICD-10-CM | POA: Diagnosis not present

## 2020-06-18 DIAGNOSIS — Z803 Family history of malignant neoplasm of breast: Secondary | ICD-10-CM | POA: Diagnosis not present

## 2020-06-18 DIAGNOSIS — Z886 Allergy status to analgesic agent status: Secondary | ICD-10-CM | POA: Diagnosis not present

## 2020-06-18 DIAGNOSIS — C50412 Malignant neoplasm of upper-outer quadrant of left female breast: Secondary | ICD-10-CM | POA: Insufficient documentation

## 2020-06-18 DIAGNOSIS — Z5181 Encounter for therapeutic drug level monitoring: Secondary | ICD-10-CM

## 2020-06-18 DIAGNOSIS — Z9049 Acquired absence of other specified parts of digestive tract: Secondary | ICD-10-CM | POA: Insufficient documentation

## 2020-06-18 DIAGNOSIS — Z923 Personal history of irradiation: Secondary | ICD-10-CM | POA: Insufficient documentation

## 2020-06-18 DIAGNOSIS — Z87440 Personal history of urinary (tract) infections: Secondary | ICD-10-CM | POA: Insufficient documentation

## 2020-06-18 DIAGNOSIS — Z79899 Other long term (current) drug therapy: Secondary | ICD-10-CM | POA: Diagnosis not present

## 2020-06-18 DIAGNOSIS — Z08 Encounter for follow-up examination after completed treatment for malignant neoplasm: Secondary | ICD-10-CM

## 2020-06-18 LAB — COMPREHENSIVE METABOLIC PANEL
ALT: 45 U/L — ABNORMAL HIGH (ref 0–44)
AST: 26 U/L (ref 15–41)
Albumin: 4.4 g/dL (ref 3.5–5.0)
Alkaline Phosphatase: 60 U/L (ref 38–126)
Anion gap: 11 (ref 5–15)
BUN: 20 mg/dL (ref 6–20)
CO2: 26 mmol/L (ref 22–32)
Calcium: 9.2 mg/dL (ref 8.9–10.3)
Chloride: 105 mmol/L (ref 98–111)
Creatinine, Ser: 0.75 mg/dL (ref 0.44–1.00)
GFR calc Af Amer: >60 mL/min (ref >60)
GFR calc non Af Amer: >60 mL/min (ref >60)
Glucose, Bld: 107 mg/dL — ABNORMAL HIGH (ref 70–99)
Potassium: 4.3 mmol/L (ref 3.5–5.1)
Sodium: 142 mmol/L (ref 135–145)
Total Bilirubin: 0.6 mg/dL (ref 0.3–1.2)
Total Protein: 7.5 g/dL (ref 6.5–8.1)

## 2020-06-18 LAB — CBC WITH DIFFERENTIAL/PLATELET
Abs Immature Granulocytes: 0.02 10*3/uL (ref 0.00–0.07)
Basophils Absolute: 0.1 10*3/uL (ref 0.0–0.1)
Basophils Relative: 1 %
Eosinophils Absolute: 0.2 10*3/uL (ref 0.0–0.5)
Eosinophils Relative: 3 %
HCT: 36.4 % (ref 36.0–46.0)
Hemoglobin: 11.9 g/dL — ABNORMAL LOW (ref 12.0–15.0)
Immature Granulocytes: 0 %
Lymphocytes Relative: 34 %
Lymphs Abs: 2 10*3/uL (ref 0.7–4.0)
MCH: 29.7 pg (ref 26.0–34.0)
MCHC: 32.7 g/dL (ref 30.0–36.0)
MCV: 90.8 fL (ref 80.0–100.0)
Monocytes Absolute: 0.7 10*3/uL (ref 0.1–1.0)
Monocytes Relative: 12 %
Neutro Abs: 3 10*3/uL (ref 1.7–7.7)
Neutrophils Relative %: 50 %
Platelets: 166 10*3/uL (ref 150–400)
RBC: 4.01 MIL/uL (ref 3.87–5.11)
RDW: 12.8 % (ref 11.5–15.5)
WBC: 5.9 10*3/uL (ref 4.0–10.5)
nRBC: 0 % (ref 0.0–0.2)

## 2020-06-24 NOTE — Progress Notes (Signed)
Hematology/Oncology Consult note [facility]  Telephone:([phone] [phone]  Patient Care Team: Sindy Guadeloupe, MD as PCP - General (Oncology) Sindy Guadeloupe, MD as Consulting Physician (Hematology and Oncology)   Name of the patient: Dana Harrell  [id number]  [birth year]   Date of visit: 06/24/20  Diagnosis-stage I left breast cancer s/p lumpectomy  Chief complaint/ Reason for visit-routine follow-up of breast cancer  Heme/Onc history: Patient is a 54 year old female who was diagnosed with stage I invasive mammary carcinoma of the left breast 2.3 cm with negative margins.  1 sentinel lymph node was negative for malignancy.  PT 2 PN 0.  Oncotype testing showed intermediate score of 21.  Risks and benefits of chemotherapy were discussed and adjuvant chemotherapy was not offered after mutual discussion.  Lupron plus AI was started in September 2018 the patient developed significant headache following that and could not tolerate ovarian suppression.  She was started on tamoxifen in March 2021 her levels were postmenopausal and was switched to Arimidex.  However patient could not tolerate Arimidex due to significant fatigue and joint pain and preferred to go back to tamoxifen  Interval history-patient reports tolerating tamoxifen well without any significant side effects.  She is also taking her calcium and vitamin D.  Appetite and weight has remained stable.  ECOG PS- 0 Pain scale- 0   Review of systems- Review of Systems  Constitutional: Negative for chills, fever, malaise/fatigue and weight loss.  HENT: Negative for congestion, ear discharge and nosebleeds.   Eyes: Negative for blurred vision.  Respiratory: Negative for cough, hemoptysis, sputum production, shortness of breath and wheezing.   Cardiovascular: Negative for chest pain, palpitations, orthopnea and claudication.  Gastrointestinal: Negative for abdominal pain, blood in stool,  constipation, diarrhea, heartburn, melena, nausea and vomiting.  Genitourinary: Negative for dysuria, flank pain, frequency, hematuria and urgency.  Musculoskeletal: Negative for back pain, joint pain and myalgias.  Skin: Negative for rash.  Neurological: Negative for dizziness, tingling, focal weakness, seizures, weakness and headaches.  Endo/Heme/Allergies: Does not bruise/bleed easily.  Psychiatric/Behavioral: Negative for depression and suicidal ideas. The patient does not have insomnia.       Allergies  Allergen Reactions   Lovastatin Other (See Comments)    Chest pain   Motrin [Ibuprofen] Other (See Comments)    Hx of renal failure   Promethazine Other (See Comments)    Excessive sleepiness.     Past Medical History:  Diagnosis Date   Anxiety    Breast cancer ([place]) 2018   invasive mammary and low grade DCIS   Chronic kidney disease 2009   H/O ACUTE KIDNEY FAILURE WITH RHABDOMYOLOSIS   GERD (gastroesophageal reflux disease)    History of kidney stones    Hypertension    Personal history of radiation therapy 2018   left breast cancer   Rhabdomyolysis    2009   UTI (urinary tract infection)      Past Surgical History:  Procedure Laterality Date   BREAST BIOPSY Left 2018   invasive mammary carcinoma and low grade DCIS   BREAST LUMPECTOMY Left 2018   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   CHOLECYSTECTOMY     COLONOSCOPY     4/17   MASTECTOMY, PARTIAL Left 03/09/2017   Procedure: MASTECTOMY PARTIAL;  Surgeon: Leonie Green, MD;  Location: [facility] ORS;  Service: General;  Laterality: Left;   MS MAMMO SCREENING     4/18   OTHER SURGICAL HISTORY  pap 4/18   SENTINEL NODE BIOPSY Left 03/09/2017   Procedure: SENTINEL NODE BIOPSY;  Surgeon: Leonie Green, MD;  Location: [facility] ORS;  Service: General;  Laterality: Left;    Social History   Socioeconomic History   Marital status: Married    Spouse name: Not on file     Number of children: Not on file   Years of education: Not on file   Highest education level: Not on file  Occupational History   Not on file  Tobacco Use   Smoking status: Never Smoker   Smokeless tobacco: Never Used  Vaping Use   Vaping Use: Never used  Substance and Sexual Activity   Alcohol use: Yes    Comment: socially   Drug use: No   Sexual activity: Yes  Other Topics Concern   Not on file  Social History Narrative   Not on file   Social Determinants of Health   Financial Resource Strain:    Difficulty of Paying Living Expenses: Not on file  Food Insecurity:    Worried About [place] in the Last Year: Not on file   Ran Out of Food in the Last Year: Not on file  Transportation Needs:    Lack of Transportation (Medical): Not on file   Lack of Transportation (Non-Medical): Not on file  Physical Activity:    Days of Exercise per Week: Not on file   Minutes of Exercise per Session: Not on file  Stress:    Feeling of Stress : Not on file  Social Connections:    Frequency of Communication with Friends and Family: Not on file   Frequency of Social Gatherings with Friends and Family: Not on file   Attends Religious Services: Not on file   Active Member of Clubs or Organizations: Not on file   Attends Archivist Meetings: Not on file   Marital Status: Not on file  Intimate Partner Violence:    Fear of Current or Ex-Partner: Not on file   Emotionally Abused: Not on file   Physically Abused: Not on file   Sexually Abused: Not on file    Family History  Problem Relation Age of Onset   Colon cancer Mother 18   Breast cancer Maternal Aunt        3 materal aunts all 50's   Breast cancer Maternal Aunt    Breast cancer Maternal Aunt      Current Outpatient Medications:    acetaminophen (TYLENOL) 500 MG tablet, Take 1,000 mg by mouth every 6 (six) hours as needed., Disp: , Rfl:    amLODipine (NORVASC) 10 MG  tablet, Take 10 mg by mouth every morning. , Disp: , Rfl:    RABEprazole (ACIPHEX) 20 MG tablet, Take 20 mg by mouth daily., Disp: , Rfl:    sertraline (ZOLOFT) 50 MG tablet, Take 50 mg by mouth every morning. , Disp: , Rfl:    tamoxifen (NOLVADEX) 20 MG tablet, Take 1 tablet (20 mg total) by mouth daily., Disp: 90 tablet, Rfl: 2  Physical exam:  Vitals:   06/18/20 1320  BP: 131/77  Pulse: 86  Resp: 16  Temp: (!) 96.7 F (35.9 C)  TempSrc: Tympanic  SpO2: 100%  Weight: 132 lb (59.9 kg)   Physical Exam Constitutional:      General: She is not in acute distress. HENT:     Head: Normocephalic and atraumatic.  Pulmonary:     Effort: Pulmonary effort is normal.  Abdominal:  General: Bowel sounds are normal.     Palpations: Abdomen is soft.  Skin:    General: Skin is warm and dry.  Neurological:     Mental Status: She is alert and oriented to person, place, and time.     Breast exam was performed in seated and lying down position. Patient is status post left lumpectomy with a well-healed surgical scar. No evidence of any palpable masses. No evidence of axillary adenopathy. No evidence of any palpable masses or lumps in the right breast. No evidence of right axillary adenopathy  CMP Latest Ref Rng & Units 06/18/2020  Glucose 70 - 99 mg/dL 107(H)  BUN 6 - 20 mg/dL 20  Creatinine 0.44 - 1.00 mg/dL 0.75  Sodium 135 - 145 mmol/L 142  Potassium 3.5 - 5.1 mmol/L 4.3  Chloride 98 - 111 mmol/L 105  CO2 22 - 32 mmol/L 26  Calcium 8.9 - 10.3 mg/dL 9.2  Total Protein 6.5 - 8.1 g/dL 7.5  Total Bilirubin 0.3 - 1.2 mg/dL 0.6  Alkaline Phos 38 - 126 U/L 60  AST 15 - 41 U/L 26  ALT 0 - 44 U/L 45(H)   CBC Latest Ref Rng & Units 06/18/2020  WBC 4.0 - 10.5 K/uL 5.9  Hemoglobin 12.0 - 15.0 g/dL 11.9(L)  Hematocrit 36 - 46 % 36.4  Platelets 150 - 400 K/uL 166     Assessment and plan- Patient is a 54 y.o. female with invasive mammary carcinoma of the left breast pathological  prognostic stage I apT2 pN0 cM0 ER/PR positive HER-2 negative s/p lumpectomy and adjuvant radiation therapy.  She is currently on tamoxifen and this is a routine follow-up visit  Clinically patient is doing well with no concerning signs and symptoms of recurrence based on today's exam.  Recent mammogram from June 2021 was unremarkable.  She will continue to take tamoxifen for 10 years along with calcium and vitamin D.  I will see her back in 6 months no labs. She would be due for repeat bone density scan in June 2022.   Visit Diagnosis 1. Encounter for monitoring tamoxifen therapy   2. Encounter for follow-up surveillance of breast cancer      Dr. Randa Evens, MD, MPH [facility] [phone] 06/24/2020 8:29 AM

## 2020-06-28 ENCOUNTER — Encounter: Payer: Self-pay | Admitting: *Deleted

## 2020-07-29 ENCOUNTER — Ambulatory Visit: Payer: BC Managed Care – PPO | Admitting: Radiation Oncology

## 2020-08-12 ENCOUNTER — Ambulatory Visit: Payer: BC Managed Care – PPO | Attending: Radiation Oncology | Admitting: Radiation Oncology

## 2020-12-06 ENCOUNTER — Other Ambulatory Visit: Payer: Self-pay | Admitting: Oncology

## 2020-12-06 DIAGNOSIS — Z170 Estrogen receptor positive status [ER+]: Secondary | ICD-10-CM

## 2020-12-17 ENCOUNTER — Inpatient Hospital Stay: Payer: BC Managed Care – PPO | Attending: Oncology | Admitting: Oncology

## 2020-12-17 DIAGNOSIS — Z5181 Encounter for therapeutic drug level monitoring: Secondary | ICD-10-CM

## 2020-12-17 DIAGNOSIS — Z853 Personal history of malignant neoplasm of breast: Secondary | ICD-10-CM | POA: Diagnosis not present

## 2020-12-17 DIAGNOSIS — Z79810 Long term (current) use of selective estrogen receptor modulators (SERMs): Secondary | ICD-10-CM | POA: Diagnosis not present

## 2020-12-17 DIAGNOSIS — Z08 Encounter for follow-up examination after completed treatment for malignant neoplasm: Secondary | ICD-10-CM

## 2020-12-19 NOTE — Progress Notes (Signed)
I connected with Dana Harrell on 12/19/20 at  2:15 PM EST by video enabled telemedicine visit and verified that I am speaking with the correct person using two identifiers.   I discussed the limitations, risks, security and privacy concerns of performing an evaluation and management service by telemedicine and the availability of in-person appointments. I also discussed with the patient that there may be a patient responsible charge related to this service. The patient expressed understanding and agreed to proceed.  Other persons participating in the visit and their role in the encounter:  none  Patient's location:  home Provider's location:  work  Risk analyst Complaint:  Routine f/u of breast cancer  History of present illness: Patient is a 55 year old female who was diagnosed with stage I invasive mammary carcinoma of the left breast 2.3 cm with negative margins.  1 sentinel lymph node was negative for malignancy.  PT 2 PN 0.  Oncotype testing showed intermediate score of 21.  Risks and benefits of chemotherapy were discussed and adjuvant chemotherapy was not offered after mutual discussion.  Lupron plus AI was started in September 2018 the patient developed significant headache following that and could not tolerate ovarian suppression.  She was started on tamoxifen in March 2021 her levels were postmenopausal and was switched to Arimidex.  However patient could not tolerate Arimidex due to significant fatigue and joint pain and preferred to go back to tamoxifen   Interval history: Patient is tolerating tamoxifen well without significant side effects. She has not had menstrual cycles for over a year now   Review of Systems  Constitutional: Negative for chills, fever, malaise/fatigue and weight loss.  HENT: Negative for congestion, ear discharge and nosebleeds.   Eyes: Negative for blurred vision.  Respiratory: Negative for cough, hemoptysis, sputum production, shortness of breath and wheezing.    Cardiovascular: Negative for chest pain, palpitations, orthopnea and claudication.  Gastrointestinal: Negative for abdominal pain, blood in stool, constipation, diarrhea, heartburn, melena, nausea and vomiting.  Genitourinary: Negative for dysuria, flank pain, frequency, hematuria and urgency.  Musculoskeletal: Negative for back pain, joint pain and myalgias.  Skin: Negative for rash.  Neurological: Negative for dizziness, tingling, focal weakness, seizures, weakness and headaches.  Endo/Heme/Allergies: Does not bruise/bleed easily.  Psychiatric/Behavioral: Negative for depression and suicidal ideas. The patient does not have insomnia.     Allergies  Allergen Reactions  . Lovastatin Other (See Comments)    Chest pain  . Motrin [Ibuprofen] Other (See Comments)    Hx of renal failure  . Promethazine Other (See Comments)    Excessive sleepiness.    Past Medical History:  Diagnosis Date  . Anxiety   . Breast cancer ([place]) 2018   invasive mammary and low grade DCIS  . Chronic kidney disease 2009   H/O ACUTE KIDNEY FAILURE WITH RHABDOMYOLOSIS  . GERD (gastroesophageal reflux disease)   . History of kidney stones   . Hypertension   . Personal history of radiation therapy 2018   left breast cancer  . Rhabdomyolysis    2009  . UTI (urinary tract infection)     Past Surgical History:  Procedure Laterality Date  . 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 BREAST LUMPECTOMY Left 2018   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  . CHOLECYSTECTOMY    . COLONOSCOPY     4/17  . MASTECTOMY, PARTIAL Left 03/09/2017   Procedure: MASTECTOMY PARTIAL;  Surgeon: Leonie Green, MD;  Location:  [facility] ORS;  Service: General;  Laterality: Left;  . MS MAMMO SCREENING     4/18  . OTHER SURGICAL HISTORY     pap 4/18  . SENTINEL NODE BIOPSY Left 03/09/2017   Procedure: SENTINEL NODE BIOPSY;  Surgeon: Leonie Green, MD;  Location: [facility] ORS;   Service: General;  Laterality: Left;    Social History   Socioeconomic History  . Marital status: Married    Spouse name: Not on file  . Number of children: Not on file  . Years of education: Not on file  . Highest education level: Not on file  Occupational History  . Not on file  Tobacco Use  . Smoking status: Never Smoker  . Smokeless tobacco: Never Used  Vaping Use  . Vaping Use: Never used  Substance and Sexual Activity  . Alcohol use: Yes    Comment: socially  . Drug use: No  . Sexual activity: Yes  Other Topics Concern  . Not on file  Social History Narrative  . Not on file   Social Determinants of Health   Financial Resource Strain: Not on file  Food Insecurity: Not on file  Transportation Needs: Not on file  Physical Activity: Not on file  Stress: Not on file  Social Connections: Not on file  Intimate Partner Violence: Not on file    Family History  Problem Relation Age of Onset  . Colon cancer Mother 16  . Breast cancer Maternal Aunt        3 materal aunts all 16's  . Breast cancer Maternal Aunt   . Breast cancer Maternal Aunt      Current Outpatient Medications:  .  acetaminophen (TYLENOL) 500 MG tablet, Take 1,000 mg by mouth every 6 (six) hours as needed., Disp: , Rfl:  .  amLODipine (NORVASC) 10 MG tablet, Take 10 mg by mouth every morning. , Disp: , Rfl:  .  RABEprazole (ACIPHEX) 20 MG tablet, Take 20 mg by mouth daily., Disp: , Rfl:  .  sertraline (ZOLOFT) 50 MG tablet, Take 50 mg by mouth every morning. , Disp: , Rfl:  .  tamoxifen (NOLVADEX) 20 MG tablet, Take 1 tablet (20 mg total) by mouth daily., Disp: 90 tablet, Rfl: 2  No results found.  No images are attached to the encounter.   CMP Latest Ref Rng & Units 06/18/2020  Glucose 70 - 99 mg/dL 107(H)  BUN 6 - 20 mg/dL 20  Creatinine 0.44 - 1.00 mg/dL 0.75  Sodium 135 - 145 mmol/L 142  Potassium 3.5 - 5.1 mmol/L 4.3  Chloride 98 - 111 mmol/L 105  CO2 22 - 32 mmol/L 26  Calcium 8.9  - 10.3 mg/dL 9.2  Total Protein 6.5 - 8.1 g/dL 7.5  Total Bilirubin 0.3 - 1.2 mg/dL 0.6  Alkaline Phos 38 - 126 U/L 60  AST 15 - 41 U/L 26  ALT 0 - 44 U/L 45(H)   CBC Latest Ref Rng & Units 06/18/2020  WBC 4.0 - 10.5 K/uL 5.9  Hemoglobin 12.0 - 15.0 g/dL 11.9(L)  Hematocrit 36.0 - 46.0 % 36.4  Platelets 150 - 400 K/uL 166     Observation/objective: Appears in no acute distress over video visit today. Breathing is non labored  Assessment and plan:Patient is a 55 year old old female with with invasive mammary carcinoma of the left breast pathological prognostic stage I apT2 pN0 cM0 ER/PR positive HER-2 negative s/p lumpectomy and adjuvant radiation therapy. This is a routine f/u of breast  cancer  Clinically patient is doing well with no concerning symptoms of recurrence on todays visit. She is tolerating tamoxifen well without any significant side effects.   She will be due for mammogram and bone density scan in June 2022 which I will order. I will see her in 6 months for  Breast exam  Follow-up instructions:as above  I discussed the assessment and treatment plan with the patient. The patient was provided an opportunity to ask questions and all were answered. The patient agreed with the plan and demonstrated an understanding of the instructions.   The patient was advised to call back or seek an in-person evaluation if the symptoms worsen or if the condition fails to improve as anticipated.   Visit Diagnosis: 1. Encounter for monitoring tamoxifen therapy   2. Encounter for follow-up surveillance of breast cancer     Dr. Randa Evens, MD, MPH [facility] Tel- [phone] 12/19/2020 2:55 PM

## 2021-01-21 ENCOUNTER — Other Ambulatory Visit: Payer: Self-pay | Admitting: Oncology

## 2021-04-04 ENCOUNTER — Ambulatory Visit
Admission: RE | Admit: 2021-04-04 | Discharge: 2021-04-04 | Disposition: A | Discharge status: Home / Self Care | Payer: BC Managed Care – PPO | Source: Ambulatory Visit | Attending: Oncology | Admitting: Oncology

## 2021-04-04 ENCOUNTER — Other Ambulatory Visit: Payer: Self-pay

## 2021-04-04 DIAGNOSIS — Z5181 Encounter for therapeutic drug level monitoring: Secondary | ICD-10-CM | POA: Diagnosis not present

## 2021-04-04 DIAGNOSIS — Z79810 Long term (current) use of selective estrogen receptor modulators (SERMs): Secondary | ICD-10-CM | POA: Diagnosis present

## 2021-06-20 ENCOUNTER — Inpatient Hospital Stay: Payer: BC Managed Care – PPO | Attending: Oncology | Admitting: Oncology

## 2021-10-18 ENCOUNTER — Other Ambulatory Visit: Payer: Self-pay | Admitting: Oncology

## 2022-05-29 ENCOUNTER — Other Ambulatory Visit: Payer: Self-pay | Admitting: Nurse Practitioner

## 2022-06-29 ENCOUNTER — Other Ambulatory Visit: Payer: Self-pay | Admitting: Gerontology

## 2022-06-29 DIAGNOSIS — Z1231 Encounter for screening mammogram for malignant neoplasm of breast: Secondary | ICD-10-CM

## 2022-07-20 ENCOUNTER — Ambulatory Visit
Admission: RE | Admit: 2022-07-20 | Discharge: 2022-07-20 | Disposition: A | Discharge status: Home / Self Care | Payer: BC Managed Care – PPO | Source: Ambulatory Visit | Attending: Gerontology | Admitting: Gerontology

## 2022-07-20 DIAGNOSIS — Z1231 Encounter for screening mammogram for malignant neoplasm of breast: Secondary | ICD-10-CM | POA: Insufficient documentation

## 2022-12-11 DIAGNOSIS — N841 Polyp of cervix uteri: Secondary | ICD-10-CM | POA: Diagnosis not present

## 2022-12-11 DIAGNOSIS — N840 Polyp of corpus uteri: Secondary | ICD-10-CM | POA: Diagnosis not present

## 2022-12-11 DIAGNOSIS — N8501 Benign endometrial hyperplasia: Secondary | ICD-10-CM | POA: Diagnosis not present

## 2022-12-11 DIAGNOSIS — Z124 Encounter for screening for malignant neoplasm of cervix: Secondary | ICD-10-CM | POA: Diagnosis not present

## 2022-12-11 DIAGNOSIS — N950 Postmenopausal bleeding: Secondary | ICD-10-CM | POA: Diagnosis not present

## 2022-12-25 DIAGNOSIS — N8111 Cystocele, midline: Secondary | ICD-10-CM | POA: Diagnosis not present

## 2022-12-25 DIAGNOSIS — N841 Polyp of cervix uteri: Secondary | ICD-10-CM | POA: Diagnosis not present

## 2022-12-25 DIAGNOSIS — N814 Uterovaginal prolapse, unspecified: Secondary | ICD-10-CM | POA: Diagnosis not present

## 2022-12-25 DIAGNOSIS — N816 Rectocele: Secondary | ICD-10-CM | POA: Diagnosis not present

## 2023-01-02 DIAGNOSIS — N950 Postmenopausal bleeding: Secondary | ICD-10-CM | POA: Diagnosis not present

## 2023-01-02 DIAGNOSIS — N814 Uterovaginal prolapse, unspecified: Secondary | ICD-10-CM | POA: Diagnosis not present

## 2023-01-02 DIAGNOSIS — N840 Polyp of corpus uteri: Secondary | ICD-10-CM | POA: Diagnosis not present

## 2023-01-02 DIAGNOSIS — N841 Polyp of cervix uteri: Secondary | ICD-10-CM | POA: Diagnosis not present

## 2023-01-04 ENCOUNTER — Other Ambulatory Visit: Payer: Self-pay | Admitting: Obstetrics and Gynecology

## 2023-01-25 NOTE — H&P (Signed)
Dana Harrell is a 57 y.o. female here for TVH and BSO . Pt here for follow up for 2 issues c/o of prolapse of tissue and finding of large endometrial polyp removed , embx done 12/11/22 . Results :  Comment: Part A-Endometrial Biopsy: FRAGMENTS OF INACTIVE ENDOMETRIUM., AND BENIGN ENDOMETRIAL POLYP. NO HYPERPLASIA OR CARCINOMA. Part B-Cervical Polyp(s): FOCAL SIMPLE HYPERPLASIA WITHOUT ATYPIA., IN FRAGMENTS OF ENDOMETRIAL POLYP.    She states since polyp removal her sensation of falling tissue has subsided .   Here today for SIS and discussion of management   Past Medical History:  has a past medical history of Anxiety, Aortic ejection murmur (09/18/2017), Breast cancer (CMS-HCC) (02/2017), Diverticulosis (03/06/14), Gallstones, GERD (gastroesophageal reflux disease), Hyperlipidemia, Hypertension, Insomnia, and Rhabdomyolysis (2009).  Past Surgical History:  has a past surgical history that includes Upper gastrointestinal endoscopy (03/13/2008); Colonoscopy (03/06/14); Cholecystectomy; Mastectomy (Left, 03/2017); and Breast surgery (Left, 03/09/2017). Family History: family history includes Breast cancer in her maternal aunt, maternal aunt, and maternal aunt; Colon cancer in her mother; Diabetes Type 1/Insulin Dependent in her brother; Diabetes type II in her mother and paternal grandmother; No Known Problems in her father. Social History:  reports that she has never smoked. She has never used smokeless tobacco. She reports current alcohol use. She reports that she does not use drugs. OB/GYN History:  OB History       Gravida  3   Para  3   Term      Preterm      AB      Living  3        SAB      IAB      Ectopic      Molar      Multiple      Live Births  3             Allergies: is allergic to ibuprofen, lovastatin, and promethazine. Medications:  Current Medications    Current Outpatient Medications:    amLODIPine (NORVASC) 10 MG tablet, TAKE 1 TABLET BY MOUTH  EVERY DAY, Disp: 90 tablet, Rfl: 2   cholecalciferol (VITAMIN D3) 2,000 unit capsule, Take 3 capsules daily for 3 months, then reduce to 1 capsule daily thereafter for Vitamin D Deficiency., Disp: 360 capsule, Rfl: 11   ezetimibe (ZETIA) 10 mg tablet, TAKE 1 TABLET BY MOUTH EVERY DAY, Disp: 90 tablet, Rfl: 3   RABEprazole (ACIPHEX) 20 mg EC tablet, TAKE 1 TABLET BY MOUTH EVERY DAY, Disp: 90 tablet, Rfl: 0   sertraline (ZOLOFT) 100 MG tablet, TAKE 1 AND 1/2 TABLETS BY MOUTH ONCE DAILY, Disp: 45 tablet, Rfl: 2   tamoxifen (NOLVADEX) 20 MG tablet, TAKE 1 TABLET BY MOUTH EVERY DAY (Patient not taking: Reported on 12/25/2022), Disp: 90 tablet, Rfl: 3     Review of Systems: General:                      No fatigue or weight loss Eyes:                           No vision changes Ears:                            No hearing difficulty Respiratory:                No cough or shortness of breath Pulmonary:  No asthma or shortness of breath Cardiovascular:           No chest pain, palpitations, dyspnea on exertion Gastrointestinal:          No abdominal bloating, chronic diarrhea, constipations, masses, pain or hematochezia Genitourinary:             No hematuria, dysuria, abnormal vaginal discharge, pelvic pain, Menometrorrhagia Lymphatic:                   No swollen lymph nodes Musculoskeletal:No muscle weakness Neurologic:                  No extremity weakness, syncope, seizure disorder Psychiatric:                  No history of depression, delusions or suicidal/homicidal ideation      Exam:       Vitals:    01/26/23  0942  BP: (!) 154/95  Pulse: 97      Body mass index is 24.69 kg/m.   WDWN white/  female in NAD   Lungs: CTA  CV : RRR without murmur   Breast: exam done in sitting and lying position : No dimpling or retraction, no dominant mass, no spontaneous discharge, no axillary adenopathy Neck:  no thyromegaly Abdomen: soft , no mass, normal active bowel sounds,   non-tender, no rebound tenderness Pelvic: tanner stage 5 ,  External genitalia: vulva /labia first degree cystocele and rectocele  Urethra: no prolapse Vagina: normal physiologic d/c, adequate room for TVH   Cervix: no lesions, no cervical motion tenderness   Uterus: normal size shape and contour, non-tender Adnexa: no mass,  non-tender   Rectovaginal:  Saline infusion sonohysterography: betadine prep to the cervix followed by placement of the HSG catheter into the endometrial canal . Sterile H2O is injected while performing a transvaginal u/s . Findings:   Uterus anteverted   Thickened complex endometrium=21.42mm; possible polyp=0.67 x 0.52 x 0.63cm;  polyp seen on  saline=2.52 x 1.17  x 1.48cm   Rt ovary appears wnl Left ovary appears wnl No free fluid seen     Impression:    The primary encounter diagnosis was Endometrial polyp. Diagnoses of PMB (postmenopausal bleeding) and Uterus descensus were also pertinent to this visit.   Endometrial polyp , have to presume that this polyp was part of the endocervical polyp    Plan:  Recommend either FX D+C with myosure resection of the endometrial  polyp  vs definitive surgery TVH + BSO ( she is aware that removal of the ovaries may not be performed based on her anatomy at the time of the surgery ) Pt elects for the latter  Benefits and risks to surgery: The proposed benefit of the surgery has been discussed with the patient. The possible risks include, but are not limited to: organ injury to the bowel , bladder, ureters, and major blood vessels and nerves. There is a possibility of additional surgeries resulting from these injuries. There is also the risk of blood transfusion and the need to receive blood products during or after the procedure which may rarely lead to HIV or Hepatitis C infection. There is a risk of developing a deep venous thrombosis or a pulmonary embolism . There is the possibility of wound infection and also anesthetic  complications, even the rare possibility of death. The patient understands these risks and wishes to proceed. All questions have been answered  No follow-ups on file.   Vilma Prader, MD

## 2023-01-30 DIAGNOSIS — Z853 Personal history of malignant neoplasm of breast: Secondary | ICD-10-CM | POA: Diagnosis not present

## 2023-01-30 DIAGNOSIS — Z23 Encounter for immunization: Secondary | ICD-10-CM | POA: Diagnosis not present

## 2023-01-30 DIAGNOSIS — Z0000 Encounter for general adult medical examination without abnormal findings: Secondary | ICD-10-CM | POA: Diagnosis not present

## 2023-01-30 DIAGNOSIS — F419 Anxiety disorder, unspecified: Secondary | ICD-10-CM | POA: Diagnosis not present

## 2023-01-30 DIAGNOSIS — E559 Vitamin D deficiency, unspecified: Secondary | ICD-10-CM | POA: Diagnosis not present

## 2023-01-30 DIAGNOSIS — K219 Gastro-esophageal reflux disease without esophagitis: Secondary | ICD-10-CM | POA: Diagnosis not present

## 2023-01-30 DIAGNOSIS — E782 Mixed hyperlipidemia: Secondary | ICD-10-CM | POA: Diagnosis not present

## 2023-01-30 DIAGNOSIS — I340 Nonrheumatic mitral (valve) insufficiency: Secondary | ICD-10-CM | POA: Diagnosis not present

## 2023-01-30 DIAGNOSIS — K5730 Diverticulosis of large intestine without perforation or abscess without bleeding: Secondary | ICD-10-CM | POA: Diagnosis not present

## 2023-01-30 DIAGNOSIS — R7303 Prediabetes: Secondary | ICD-10-CM | POA: Diagnosis not present

## 2023-01-30 DIAGNOSIS — N841 Polyp of cervix uteri: Secondary | ICD-10-CM | POA: Diagnosis not present

## 2023-01-30 DIAGNOSIS — I10 Essential (primary) hypertension: Secondary | ICD-10-CM | POA: Diagnosis not present

## 2023-01-31 ENCOUNTER — Encounter: Payer: Self-pay | Admitting: Oncology

## 2023-02-07 ENCOUNTER — Encounter: Payer: Self-pay | Admitting: Obstetrics and Gynecology

## 2023-02-07 ENCOUNTER — Encounter
Admission: RE | Admit: 2023-02-07 | Discharge: 2023-02-07 | Disposition: A | Discharge status: Home / Self Care | Payer: 59 | Source: Ambulatory Visit | Attending: Obstetrics and Gynecology | Admitting: Obstetrics and Gynecology

## 2023-02-07 ENCOUNTER — Other Ambulatory Visit: Payer: Self-pay

## 2023-02-07 DIAGNOSIS — I10 Essential (primary) hypertension: Secondary | ICD-10-CM

## 2023-02-07 DIAGNOSIS — Z01812 Encounter for preprocedural laboratory examination: Secondary | ICD-10-CM

## 2023-02-07 HISTORY — DX: Family history of other specified conditions: Z84.89

## 2023-02-07 NOTE — Patient Instructions (Addendum)
Your procedure is scheduled on: 02/15/23 - Thursday Report to the Registration Desk on the 1st floor of the [facility]. To find out your arrival time, please call [phone] between 1PM - 3PM on: 02/14/23 - Wednesday If your arrival time is 6:00 am, do not arrive before that time as the [facility] entrance doors do not open until 6:00 am.  REMEMBER: Instructions that are not followed completely may result in serious medical risk, up to and including death; or upon the discretion of your surgeon and anesthesiologist your surgery may need to be rescheduled.  Do not eat food after midnight the night before surgery.  No gum chewing or hard candies.  You may however, drink CLEAR liquids up to 2 hours before you are scheduled to arrive for your surgery. Do not drink anything within 2 hours of your scheduled arrival time.  Clear liquids include: - water  - apple juice without pulp - gatorade (not RED colors) - black coffee or tea (Do NOT add milk or creamers to the coffee or tea) Do NOT drink anything that is not on this list.   One week prior to surgery: Stop Anti-inflammatories (NSAIDS) such as Advil, Aleve, Ibuprofen, Motrin, Naproxen, Naprosyn and Aspirin based products such as Excedrin, Goody's Powder, BC Powder.  Stop ANY OVER THE COUNTER supplements until after surgery.  You may  take Tylenol if needed for pain up until the day of surgery.   TAKE ONLY THESE MEDICATIONS THE MORNING OF SURGERY WITH A SIP OF WATER:  RABEprazole (ACIPHEX) - (take one the night before and one on the morning of surgery - helps to prevent nausea after surgery.) amLODipine (NORVASC)  sertraline (ZOLOFT)    No Alcohol for 24 hours before or after surgery.  No Smoking including e-cigarettes for 24 hours before surgery.  No chewable tobacco products for at least 6 hours before surgery.  No nicotine patches on the day of surgery.  Do not use any "recreational" drugs for at least a week  (preferably 2 weeks) before your surgery.  Please be advised that the combination of cocaine and anesthesia may have negative outcomes, up to and including death. If you test positive for cocaine, your surgery will be cancelled.  On the morning of surgery brush your teeth with toothpaste and water, you may rinse your mouth with mouthwash if you wish. Do not swallow any toothpaste or mouthwash.  Do not wear jewelry, make-up, hairpins, clips or nail polish.  Do not wear lotions, powders, or perfumes.   Do not shave body hair from the neck down 48 hours before surgery.  Contact lenses, hearing aids and dentures may not be worn into surgery.  Do not bring valuables to the hospital. [facility] is not responsible for any missing/lost belongings or valuables.   Notify your doctor if there is any change in your medical condition (cold, fever, infection).  Wear comfortable clothing (specific to your surgery type) to the hospital.  After surgery, you can help prevent lung complications by doing breathing exercises.  Take deep breaths and cough every 1-2 hours. Your doctor may order a device called an Incentive Spirometer to help you take deep breaths. When coughing or sneezing, hold a pillow firmly against your incision with both hands. This is called "splinting." Doing this helps protect your incision. It also decreases belly discomfort.  If you are being admitted to the hospital overnight, leave your suitcase in the car. After surgery it may be brought to your room.  In case of increased patient census, it may be necessary for you, the patient, to continue your postoperative care in the Same Day Surgery department.  If you are being discharged the day of surgery, you will not be allowed to drive home. You will need a responsible individual to drive you home and stay with you for 24 hours after surgery.   If you are taking public transportation, you will need to have a responsible individual  with you.  Please call the Pre-admissions Testing Dept. at [phone] if you have any questions about these instructions.  Surgery Visitation Policy:  Patients having surgery or a procedure may have two visitors.  Children under the age of 81 must have an adult with them who is not the patient.  Inpatient Visitation:    Visiting hours are 7 a.m. to 8 p.m. Up to four visitors are allowed at one time in a patient room. The visitors may rotate out with other people during the day.  One visitor age 30 or older may stay with the patient overnight and must be in the room by 8 p.m. How to Use an Incentive Spirometer  An incentive spirometer is a tool that measures how well you are filling your lungs with each breath. Learning to take long, deep breaths using this tool can help you keep your lungs clear and active. This may help to reverse or lessen your chance of developing breathing (pulmonary) problems, especially infection. You may be asked to use a spirometer: After a surgery. If you have a lung problem or a history of smoking. After a long period of time when you have been unable to move or be active. If the spirometer includes an indicator to show the highest number that you have reached, your health care provider or respiratory therapist will help you set a goal. Keep a log of your progress as told by your health care provider. What are the risks? Breathing too quickly may cause dizziness or cause you to pass out. Take your time so you do not get dizzy or light-headed. If you are in pain, you may need to take pain medicine before doing incentive spirometry. It is harder to take a deep breath if you are having pain. How to use your incentive spirometer  Sit up on the edge of your bed or on a chair. Hold the incentive spirometer so that it is in an upright position. Before you use the spirometer, breathe out normally. Place the mouthpiece in your mouth. Make sure your lips are closed  tightly around it. Breathe in slowly and as deeply as you can through your mouth, causing the piston or the ball to rise toward the top of the chamber. Hold your breath for 3-5 seconds, or for as long as possible. If the spirometer includes a coach indicator, use this to guide you in breathing. Slow down your breathing if the indicator goes above the marked areas. Remove the mouthpiece from your mouth and breathe out normally. The piston or ball will return to the bottom of the chamber. Rest for a few seconds, then repeat the steps 10 or more times. Take your time and take a few normal breaths between deep breaths so that you do not get dizzy or light-headed. Do this every 1-2 hours when you are awake. If the spirometer includes a goal marker to show the highest number you have reached (best effort), use this as a goal to work toward during each repetition. After each  set of 10 deep breaths, cough a few times. This will help to make sure that your lungs are clear. If you have an incision on your chest or abdomen from surgery, place a pillow or a rolled-up towel firmly against the incision when you cough. This can help to reduce pain while taking deep breaths and coughing. General tips When you are able to get out of bed: Walk around often. Continue to take deep breaths and cough in order to clear your lungs. Keep using the incentive spirometer until your health care provider says it is okay to stop using it. If you have been in the hospital, you may be told to keep using the spirometer at home. Contact a health care provider if: You are having difficulty using the spirometer. You have trouble using the spirometer as often as instructed. Your pain medicine is not giving enough relief for you to use the spirometer as told. You have a fever. Get help right away if: You develop shortness of breath. You develop a cough with bloody mucus from the lungs. You have fluid or blood coming from an  incision site after you cough. Summary An incentive spirometer is a tool that can help you learn to take long, deep breaths to keep your lungs clear and active. You may be asked to use a spirometer after a surgery, if you have a lung problem or a history of smoking, or if you have been inactive for a long period of time. Use your incentive spirometer as instructed every 1-2 hours while you are awake. If you have an incision on your chest or abdomen, place a pillow or a rolled-up towel firmly against your incision when you cough. This will help to reduce pain. Get help right away if you have shortness of breath, you cough up bloody mucus, or blood comes from your incision when you cough. This information is not intended to replace advice given to you by your health care provider. Make sure you discuss any questions you have with your health care provider. Document Revised: 12/15/2019 Document Reviewed: 12/15/2019 Elsevier Patient Education  2023 [employer].

## 2023-02-08 ENCOUNTER — Encounter
Admission: RE | Admit: 2023-02-08 | Discharge: 2023-02-08 | Disposition: A | Discharge status: Home / Self Care | Payer: 59 | Source: Ambulatory Visit | Attending: Obstetrics and Gynecology | Admitting: Obstetrics and Gynecology

## 2023-02-08 ENCOUNTER — Encounter: Payer: Self-pay | Admitting: Oncology

## 2023-02-08 DIAGNOSIS — Z01810 Encounter for preprocedural cardiovascular examination: Secondary | ICD-10-CM | POA: Diagnosis not present

## 2023-02-08 DIAGNOSIS — Z01818 Encounter for other preprocedural examination: Secondary | ICD-10-CM | POA: Insufficient documentation

## 2023-02-08 DIAGNOSIS — I10 Essential (primary) hypertension: Secondary | ICD-10-CM | POA: Diagnosis not present

## 2023-02-08 LAB — BASIC METABOLIC PANEL
Anion gap: 7 (ref 5–15)
BUN: 17 mg/dL (ref 6–20)
CO2: 26 mmol/L (ref 22–32)
Calcium: 9.5 mg/dL (ref 8.9–10.3)
Chloride: 104 mmol/L (ref 98–111)
Creatinine, Ser: 0.68 mg/dL (ref 0.44–1.00)
GFR, Estimated: >60 mL/min (ref >60)
Glucose, Bld: 118 mg/dL — ABNORMAL HIGH (ref 70–99)
Potassium: 3.7 mmol/L (ref 3.5–5.1)
Sodium: 137 mmol/L (ref 135–145)

## 2023-02-08 LAB — CBC
HCT: 37.5 % (ref 36.0–46.0)
Hemoglobin: 12.2 g/dL (ref 12.0–15.0)
MCH: 29.5 pg (ref 26.0–34.0)
MCHC: 32.5 g/dL (ref 30.0–36.0)
MCV: 90.6 fL (ref 80.0–100.0)
Platelets: 175 10*3/uL (ref 150–400)
RBC: 4.14 MIL/uL (ref 3.87–5.11)
RDW: 12.8 % (ref 11.5–15.5)
WBC: 5 10*3/uL (ref 4.0–10.5)
nRBC: 0 % (ref 0.0–0.2)

## 2023-02-08 LAB — TYPE AND SCREEN
ABO/RH(D): B POS
Antibody Screen: NEGATIVE

## 2023-02-15 ENCOUNTER — Ambulatory Visit: Payer: 59 | Admitting: Urgent Care

## 2023-02-15 ENCOUNTER — Encounter: Payer: Self-pay | Admitting: Obstetrics and Gynecology

## 2023-02-15 ENCOUNTER — Other Ambulatory Visit: Payer: Self-pay

## 2023-02-15 ENCOUNTER — Ambulatory Visit
Admission: RE | Admit: 2023-02-15 | Discharge: 2023-02-15 | Disposition: A | Discharge status: Home / Self Care | Payer: 59 | Attending: Obstetrics and Gynecology | Admitting: Obstetrics and Gynecology

## 2023-02-15 ENCOUNTER — Encounter
Admission: RE | Disposition: A | Discharge status: Home / Self Care | Payer: Self-pay | Source: Home / Self Care | Attending: Obstetrics and Gynecology

## 2023-02-15 DIAGNOSIS — F419 Anxiety disorder, unspecified: Secondary | ICD-10-CM | POA: Diagnosis not present

## 2023-02-15 DIAGNOSIS — D251 Intramural leiomyoma of uterus: Secondary | ICD-10-CM | POA: Diagnosis not present

## 2023-02-15 DIAGNOSIS — N840 Polyp of corpus uteri: Secondary | ICD-10-CM | POA: Diagnosis not present

## 2023-02-15 DIAGNOSIS — N950 Postmenopausal bleeding: Secondary | ICD-10-CM | POA: Insufficient documentation

## 2023-02-15 DIAGNOSIS — D259 Leiomyoma of uterus, unspecified: Secondary | ICD-10-CM | POA: Diagnosis not present

## 2023-02-15 DIAGNOSIS — Z853 Personal history of malignant neoplasm of breast: Secondary | ICD-10-CM | POA: Insufficient documentation

## 2023-02-15 DIAGNOSIS — K219 Gastro-esophageal reflux disease without esophagitis: Secondary | ICD-10-CM | POA: Diagnosis not present

## 2023-02-15 DIAGNOSIS — Z923 Personal history of irradiation: Secondary | ICD-10-CM | POA: Diagnosis not present

## 2023-02-15 DIAGNOSIS — I129 Hypertensive chronic kidney disease with stage 1 through stage 4 chronic kidney disease, or unspecified chronic kidney disease: Secondary | ICD-10-CM | POA: Diagnosis not present

## 2023-02-15 DIAGNOSIS — I10 Essential (primary) hypertension: Secondary | ICD-10-CM | POA: Insufficient documentation

## 2023-02-15 DIAGNOSIS — N189 Chronic kidney disease, unspecified: Secondary | ICD-10-CM | POA: Diagnosis not present

## 2023-02-15 DIAGNOSIS — Z01818 Encounter for other preprocedural examination: Secondary | ICD-10-CM

## 2023-02-15 HISTORY — PX: VAGINAL HYSTERECTOMY: SHX2639

## 2023-02-15 LAB — ABO/RH: ABO/RH(D): B POS

## 2023-02-15 SURGERY — HYSTERECTOMY, VAGINAL
Anesthesia: General | Site: Uterus | Laterality: Bilateral

## 2023-02-15 MED ORDER — POVIDONE-IODINE 10 % EX SWAB
2.0000 | Freq: Once | CUTANEOUS | Status: AC
  Administered 2023-02-15: 2 via TOPICAL

## 2023-02-15 MED ORDER — ACETAMINOPHEN 500 MG PO TABS
ORAL_TABLET | ORAL | Status: AC
  Filled 2023-02-15: qty 2

## 2023-02-15 MED ORDER — ACETAMINOPHEN 500 MG PO TABS
1000.0000 mg | ORAL_TABLET | ORAL | Status: AC
  Administered 2023-02-15: 1000 mg via ORAL

## 2023-02-15 MED ORDER — FENTANYL CITRATE (PF) 100 MCG/2ML IJ SOLN
INTRAMUSCULAR | Status: AC
  Filled 2023-02-15: qty 2

## 2023-02-15 MED ORDER — OXYCODONE HCL 5 MG/5ML PO SOLN: 5.0000 mg | Freq: Once | ORAL | Status: AC | PRN

## 2023-02-15 MED ORDER — SUGAMMADEX SODIUM 200 MG/2ML IV SOLN
INTRAVENOUS | Status: DC | PRN
  Administered 2023-02-15: 200 mg via INTRAVENOUS

## 2023-02-15 MED ORDER — CHLORHEXIDINE GLUCONATE 0.12 % MT SOLN
15.0000 mL | Freq: Once | OROMUCOSAL | Status: AC
  Administered 2023-02-15: 15 mL via OROMUCOSAL

## 2023-02-15 MED ORDER — PROPOFOL 1000 MG/100ML IV EMUL
INTRAVENOUS | Status: AC
  Filled 2023-02-15: qty 100

## 2023-02-15 MED ORDER — OXYCODONE HCL 5 MG PO TABS
5.0000 mg | ORAL_TABLET | Freq: Once | ORAL | Status: AC | PRN
  Administered 2023-02-15: 5 mg via ORAL

## 2023-02-15 MED ORDER — LIDOCAINE HCL (CARDIAC) PF 100 MG/5ML IV SOSY
PREFILLED_SYRINGE | INTRAVENOUS | Status: DC | PRN
  Administered 2023-02-15: 100 mg via INTRAVENOUS

## 2023-02-15 MED ORDER — OXYCODONE-ACETAMINOPHEN 5-325 MG PO TABS: 1.0000 | ORAL_TABLET | ORAL | Status: DC | PRN

## 2023-02-15 MED ORDER — LACTATED RINGERS IV SOLN: INTRAVENOUS | Status: DC

## 2023-02-15 MED ORDER — MIDAZOLAM HCL 2 MG/2ML IJ SOLN
INTRAMUSCULAR | Status: DC | PRN
  Administered 2023-02-15: 2 mg via INTRAVENOUS

## 2023-02-15 MED ORDER — METOPROLOL TARTRATE 5 MG/5ML IV SOLN
INTRAVENOUS | Status: DC | PRN
  Administered 2023-02-15: 2 mg via INTRAVENOUS

## 2023-02-15 MED ORDER — LIDOCAINE-EPINEPHRINE 1 %-1:100000 IJ SOLN
INTRAMUSCULAR | Status: DC | PRN
  Administered 2023-02-15: 12 mL

## 2023-02-15 MED ORDER — FENTANYL CITRATE (PF) 100 MCG/2ML IJ SOLN
INTRAMUSCULAR | Status: DC | PRN
  Administered 2023-02-15 (×2): 50 ug via INTRAVENOUS

## 2023-02-15 MED ORDER — DEXAMETHASONE SODIUM PHOSPHATE 10 MG/ML IJ SOLN
INTRAMUSCULAR | Status: DC | PRN
  Administered 2023-02-15: 10 mg via INTRAVENOUS

## 2023-02-15 MED ORDER — ONDANSETRON HCL 4 MG PO TABS: 8.0000 mg | ORAL_TABLET | Freq: Three times a day (TID) | ORAL | Status: DC | PRN

## 2023-02-15 MED ORDER — KETOROLAC TROMETHAMINE 15 MG/ML IJ SOLN
INTRAMUSCULAR | Status: DC | PRN
  Administered 2023-02-15: 30 mg via INTRAVENOUS

## 2023-02-15 MED ORDER — LIDOCAINE-EPINEPHRINE 1 %-1:100000 IJ SOLN
INTRAMUSCULAR | Status: AC
  Filled 2023-02-15: qty 1

## 2023-02-15 MED ORDER — CHLORHEXIDINE GLUCONATE 0.12 % MT SOLN
OROMUCOSAL | Status: AC
  Filled 2023-02-15: qty 15

## 2023-02-15 MED ORDER — ROCURONIUM BROMIDE 100 MG/10ML IV SOLN
INTRAVENOUS | Status: DC | PRN
  Administered 2023-02-15: 50 mg via INTRAVENOUS

## 2023-02-15 MED ORDER — PROPOFOL 10 MG/ML IV BOLUS
INTRAVENOUS | Status: DC | PRN
  Administered 2023-02-15: 100 mg via INTRAVENOUS
  Administered 2023-02-15: 150 ug/kg/min via INTRAVENOUS

## 2023-02-15 MED ORDER — OXYCODONE HCL 5 MG PO TABS
ORAL_TABLET | ORAL | Status: AC
  Filled 2023-02-15: qty 1

## 2023-02-15 MED ORDER — ORAL CARE MOUTH RINSE: 15.0000 mL | Freq: Once | OROMUCOSAL | Status: AC

## 2023-02-15 MED ORDER — MIDAZOLAM HCL 2 MG/2ML IJ SOLN
INTRAMUSCULAR | Status: AC
  Filled 2023-02-15: qty 2

## 2023-02-15 MED ORDER — CEFAZOLIN SODIUM-DEXTROSE 2-4 GM/100ML-% IV SOLN
INTRAVENOUS | Status: AC
  Filled 2023-02-15: qty 100

## 2023-02-15 MED ORDER — FENTANYL CITRATE (PF) 100 MCG/2ML IJ SOLN
25.0000 ug | INTRAMUSCULAR | Status: DC | PRN
  Administered 2023-02-15 (×2): 50 ug via INTRAVENOUS

## 2023-02-15 MED ORDER — ONDANSETRON HCL 4 MG/2ML IJ SOLN
INTRAMUSCULAR | Status: DC | PRN
  Administered 2023-02-15: 4 mg via INTRAVENOUS

## 2023-02-15 MED ORDER — CEFAZOLIN SODIUM-DEXTROSE 2-4 GM/100ML-% IV SOLN
2.0000 g | Freq: Once | INTRAVENOUS | Status: AC
  Administered 2023-02-15: 2 g via INTRAVENOUS

## 2023-02-15 MED ORDER — GABAPENTIN 300 MG PO CAPS
ORAL_CAPSULE | ORAL | Status: AC
  Filled 2023-02-15: qty 1

## 2023-02-15 MED ORDER — GABAPENTIN 300 MG PO CAPS
300.0000 mg | ORAL_CAPSULE | ORAL | Status: AC
  Administered 2023-02-15: 300 mg via ORAL

## 2023-02-15 MED ORDER — 0.9 % SODIUM CHLORIDE (POUR BTL) OPTIME
TOPICAL | Status: DC | PRN
  Administered 2023-02-15: 1000 mL

## 2023-02-15 MED ORDER — DEXMEDETOMIDINE HCL IN NACL 80 MCG/20ML IV SOLN
INTRAVENOUS | Status: DC | PRN
  Administered 2023-02-15: 8 ug via INTRAVENOUS
  Administered 2023-02-15: 12 ug via INTRAVENOUS

## 2023-02-15 SURGICAL SUPPLY — 42 items
BAG DRN RND TRDRP ANRFLXCHMBR (UROLOGICAL SUPPLIES)
BAG URINE DRAIN 2000ML AR STRL (UROLOGICAL SUPPLIES) IMPLANT
CATH FOLEY 2WAY  5CC 16FR (CATHETERS)
CATH FOLEY 2WAY 5CC 16FR (CATHETERS)
CATH ROBINSON RED A/P 16FR (CATHETERS) ×1 IMPLANT
CATH URTH 16FR FL 2W BLN LF (CATHETERS) IMPLANT
DRAPE PERI LITHO V/GYN (MISCELLANEOUS) ×1 IMPLANT
DRAPE SURG 17X11 SM STRL (DRAPES) ×1 IMPLANT
DRAPE UNDER BUTTOCK W/FLU (DRAPES) ×1 IMPLANT
ELECT REM PT RETURN 9FT ADLT (ELECTROSURGICAL) ×1
ELECTRODE REM PT RTRN 9FT ADLT (ELECTROSURGICAL) ×1 IMPLANT
GAUZE 4X4 16PLY ~~LOC~~+RFID DBL (SPONGE) ×2 IMPLANT
GLOVE SURG SYN 8.0 (GLOVE) ×1 IMPLANT
GLOVE SURG SYN 8.0 PF PI (GLOVE) ×1 IMPLANT
GOWN STRL REUS W/ TWL LRG LVL3 (GOWN DISPOSABLE) ×3 IMPLANT
GOWN STRL REUS W/ TWL XL LVL3 (GOWN DISPOSABLE) ×1 IMPLANT
GOWN STRL REUS W/TWL LRG LVL3 (GOWN DISPOSABLE) ×3
GOWN STRL REUS W/TWL XL LVL3 (GOWN DISPOSABLE) ×1
KIT TURNOVER CYSTO (KITS) ×1 IMPLANT
LABEL OR SOLS (LABEL) ×1 IMPLANT
MANIFOLD NEPTUNE II (INSTRUMENTS) ×1 IMPLANT
NDL HYPO 22X1.5 SAFETY MO (MISCELLANEOUS) ×1 IMPLANT
NEEDLE HYPO 22X1.5 SAFETY MO (MISCELLANEOUS) ×1 IMPLANT
NS IRRIG 1000ML POUR BTL (IV SOLUTION) IMPLANT
PACK BASIN MINOR ARMC (MISCELLANEOUS) ×1 IMPLANT
PAD OB MATERNITY 4.3X12.25 (PERSONAL CARE ITEMS) ×1 IMPLANT
PAD PREP OB/GYN DISP 24X41 (PERSONAL CARE ITEMS) ×1 IMPLANT
SCRUB CHG 4% DYNA-HEX 4OZ (MISCELLANEOUS) ×1 IMPLANT
SOL SCRUB PVP POV-IOD 4OZ 7.5% (MISCELLANEOUS) ×2
SOLUTION SCRB POV-IOD 4OZ 7.5% (MISCELLANEOUS) ×1 IMPLANT
SURGILUBE 2OZ TUBE FLIPTOP (MISCELLANEOUS) ×1 IMPLANT
SUT PDS 2-0 27IN (SUTURE) IMPLANT
SUT VIC AB 0 CT1 27 (SUTURE) ×3
SUT VIC AB 0 CT1 27XCR 8 STRN (SUTURE) ×2 IMPLANT
SUT VIC AB 0 CT1 36 (SUTURE) ×1 IMPLANT
SUT VIC AB 2-0 SH 27 (SUTURE) ×5
SUT VIC AB 2-0 SH 27XBRD (SUTURE) ×1 IMPLANT
SYR 10ML LL (SYRINGE) ×1 IMPLANT
SYR CONTROL 10ML LL (SYRINGE) ×1 IMPLANT
TRAP FLUID SMOKE EVACUATOR (MISCELLANEOUS) ×1 IMPLANT
WATER STERILE IRR 1000ML POUR (IV SOLUTION) ×1 IMPLANT
WATER STERILE IRR 500ML POUR (IV SOLUTION) ×1 IMPLANT

## 2023-02-15 NOTE — Transfer of Care (Signed)
Immediate Anesthesia Transfer of Care Note  Patient: Dana Harrell  Procedure(s) Performed: HYSTERECTOMY VAGINAL WITH BILATERAL SALPINGECTOMY (Bilateral: Uterus)  Patient Location: PACU  Anesthesia Type:General  Level of Consciousness: awake, alert , and oriented  Airway & Oxygen Therapy: Patient Spontanous Breathing and Patient connected to face mask oxygen  Post-op Assessment: Report given to RN and Post -op Vital signs reviewed and stable  Post vital signs: stable  Last Vitals:  Vitals Value Taken Time  BP 87/52 02/15/23 0938  Temp    Pulse 70 02/15/23 0941  Resp 18 02/15/23 0941  SpO2 100 % 02/15/23 0941  Vitals shown include unvalidated device data.  Last Pain:  Vitals:   02/15/23 0615  TempSrc: Oral  PainSc: 0-No pain         Complications:  Encounter Notable Events  Notable Event Outcome Phase Comment  Difficult to intubate - expected  Intraprocedure Filed from anesthesia note documentation.

## 2023-02-15 NOTE — Op Note (Signed)
NAME: Dana Harrell, Dana Harrell MEDICAL RECORD NO: [id number] ACCOUNT NO: [id number] DATE OF BIRTH: 1965-12-26 FACILITY: [facility] LOCATION: [facility] PHYSICIAN: Suzy Bouchard, MD  Operative Report   DATE OF PROCEDURE: 02/15/2023  PREOPERATIVE DIAGNOSES:  1.  Postmenopausal bleeding. 2.  Endometrial polyps.  POSTOPERATIVE DIAGNOSES:  1.  Postmenopausal bleeding. 2.  Endometrial polyps.  PROCEDURE:  Total vaginal hysterectomy, bilateral salpingectomy.  ANESTHESIA:  General endotracheal anesthesia.  SURGEON:  Suzy Bouchard, MD  FIRST, ASSISTANT:  Christeen Douglas, MD  INDICATIONS:  A 57 year old patient with prolapsing cervical polyps with postmenopausal bleeding and noted to have a 2.5 x 1.1 x 1.4 cm endometrial polyp.  The patient was offered MyoSure resection of the polyp and she elects for definitive surgical  intervention, i.e., hysterectomy.  DESCRIPTION OF PROCEDURE:  After adequate general endotracheal anesthesia, the patient was placed in dorsal supine position with legs in the candy cane stirrups.  The patient's lower abdomen, perineum and vagina were prepped and draped in normal sterile  fashion.  The patient did receive 2 grams of IV Ancef for surgical prophylaxis.  Timeout was performed.  Weighted speculum was placed in the posterior vaginal vault.  Cervix was grasped with 2 thyroid tenacula.  The bladder was drained, yielding 150 mL  clear urine.  Cervix was then circumferentially injected with 0.5% lidocaine with 1:100,000 epinephrine.  A direct posterior colpotomy incision was made.  upon entry into the posterior cul-de-sac. The long-billed weighted speculum was placed.   Uterosacral ligaments were bilaterally clamped, transected and tagged for later identification.  Anterior cervix was circumferentially incised with the Bovie.  The cardinal ligaments were then bilaterally clamped, transected and suture ligated with 0  Vicryl suture.  The anterior cervix was then  circumferentially incised and the anterior cul-de-sac was entered sharply.  Deaver retractor was placed within to elevate the bladder anteriorly.  Uterine arteries were bilaterally clamped, transected and  suture ligated and sequential clamping, cutting and suturing to the level of the cornua occurred.  The cornua were then bilaterally clamped.  The cervix and central portion of the uterus was then cored out with the Bovie given the girth of the uterus.   The cornua were then bilaterally clamped, transected and doubly suture ligated with 0 Vicryl suture.  The remaining portion of the uterus was delivered and passed off the operative field.  Portion of each fallopian tube was grasped, clamped and removed  and suture ligated with 0 Vicryl suture.  Good hemostasis was noted.  Several additional figure-of-eight sutures were required for hemostasis.  The peritoneum was then closed with a 2-0 PDS suture in a pursestring fashion and the vaginal cuff was then  closed vertically with a running 0 Vicryl suture.  The uterosacral ligaments were plicated centrally and the rest of the cuff was closed.  Good hemostasis noted.  Straight catheterization of the bladder at the end of the case yielded additional 50 mL  clear urine.  There were no complications.  ESTIMATED BLOOD LOSS:  50 mL  INTRAOPERATIVE FLUIDS:  1000 mL  URINE OUTPUT:  Total 200 mL  The patient did receive 30 mg intravenous Toradol at the end of the procedure and she was taken to recovery room in good condition.     PAA D: 02/15/2023 10:04:49 am T: 02/15/2023 10:30:00 am  JOB: [id number]/ [id number]

## 2023-02-15 NOTE — Discharge Instructions (Signed)
AMBULATORY SURGERY  ?DISCHARGE INSTRUCTIONS ? ? ?The drugs that you were given will stay in your system until tomorrow so for the next 24 hours you should not: ? ?Drive an automobile ?Make any legal decisions ?Drink any alcoholic beverage ? ? ?You may resume regular meals tomorrow.  Today it is better to start with liquids and gradually work up to solid foods. ? ?You may eat anything you prefer, but it is better to start with liquids, then soup and crackers, and gradually work up to solid foods. ? ? ?Please notify your doctor immediately if you have any unusual bleeding, trouble breathing, redness and pain at the surgery site, drainage, fever, or pain not relieved by medication. ? ? ? ?Additional Instructions: ? ? ? ?Please contact your physician with any problems or Same Day Surgery at [phone], Monday through Friday 6 am to 4 pm, or [facility] [place] Main number at [phone].  ?

## 2023-02-15 NOTE — Anesthesia Procedure Notes (Signed)
Procedure Name: Intubation Date/Time: 02/15/2023 7:45 AM  Performed by: Maryla Morrow., CRNAPre-anesthesia Checklist: Patient identified, Patient being monitored, Timeout performed, Emergency Drugs available and Suction available Patient Re-evaluated:Patient Re-evaluated prior to induction Oxygen Delivery Method: Circle system utilized Preoxygenation: Pre-oxygenation with 100% oxygen Induction Type: IV induction Ventilation: Mask ventilation without difficulty Laryngoscope Size: 3 and McGraph Grade View: Grade I Tube type: Oral Tube size: 7.0 mm Number of attempts: 1 Airway Equipment and Method: Stylet Placement Confirmation: ETT inserted through vocal cords under direct vision, positive ETCO2 and breath sounds checked- equal and bilateral Secured at: 22 cm Tube secured with: Tape Dental Injury: Teeth and Oropharynx as per pre-operative assessment  Difficulty Due To: Difficulty was anticipated and Difficult Airway- due to anterior larynx

## 2023-02-15 NOTE — Anesthesia Preprocedure Evaluation (Signed)
Anesthesia Evaluation  Patient identified by MRN, date of birth, ID band Patient awake    Reviewed: Allergy & Precautions, NPO status , Patient's Chart, lab work & pertinent test results  History of Anesthesia Complications Negative for: history of anesthetic complications  Airway Mallampati: III  TM Distance: <3 FB Neck ROM: full    Dental  (+) Chipped   Pulmonary neg pulmonary ROS, neg shortness of breath   Pulmonary exam normal        Cardiovascular Exercise Tolerance: Good hypertension, (-) angina (-) Past MI and (-) DOE Normal cardiovascular exam     Neuro/Psych  Neuromuscular disease  negative psych ROS   GI/Hepatic Neg liver ROS,GERD  Controlled,,  Endo/Other  negative endocrine ROS    Renal/GU CRFRenal disease     Musculoskeletal   Abdominal   Peds  Hematology negative hematology ROS (+)   Anesthesia Other Findings Past Medical History: No date: Anxiety 2018: Breast cancer (HCC)     Comment:  invasive mammary and low grade DCIS No date: Family history of adverse reaction to anesthesia     Comment:  sister PONV No date: GERD (gastroesophageal reflux disease) No date: History of kidney stones No date: Hypertension 2018: Personal history of radiation therapy     Comment:  left breast cancer 2009: Rhabdomyolysis  Past Surgical History: 2018: BREAST BIOPSY; Left     Comment:  invasive mammary carcinoma and low grade DCIS 2018: BREAST LUMPECTOMY; Left     Comment:  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 No date: CHOLECYSTECTOMY No date: COLONOSCOPY     Comment:  4/17 03/09/2017: MASTECTOMY, PARTIAL; Left     Comment:  Procedure: MASTECTOMY PARTIAL;  Surgeon: Nadeen Landau, MD;  Location: [facility];  Service: General;                Laterality: Left; No date: MS MAMMO SCREENING     Comment:  4/18 No date: OTHER SURGICAL HISTORY     Comment:  pap 4/18 03/09/2017:  SENTINEL NODE BIOPSY; Left     Comment:  Procedure: SENTINEL NODE BIOPSY;  Surgeon: Nadeen Landau, MD;  Location: [facility];  Service: General;                Laterality: Left;  BMI    Body Mass Index: 24.21 kg/m      Reproductive/Obstetrics negative OB ROS                             Anesthesia Physical Anesthesia Plan  ASA: 3  Anesthesia Plan: General ETT   Post-op Pain Management:    Induction: Intravenous  PONV Risk Score and Plan: Ondansetron, Dexamethasone, Midazolam and Treatment may vary due to age or medical condition  Airway Management Planned: Oral ETT  Additional Equipment:   Intra-op Plan:   Post-operative Plan: Extubation in OR  Informed Consent: I have reviewed the patients History and Physical, chart, labs and discussed the procedure including the risks, benefits and alternatives for the proposed anesthesia with the patient or authorized representative who has indicated his/her understanding and acceptance.     Dental Advisory Given  Plan Discussed with: Anesthesiologist, CRNA and Surgeon  Anesthesia Plan Comments: (Patient consented for risks of anesthesia including but not limited to:  - adverse reactions  to medications - damage to eyes, teeth, lips or other oral mucosa - nerve damage due to positioning  - sore throat or hoarseness - Damage to heart, brain, nerves, lungs, other parts of body or loss of life  Patient voiced understanding.)       Anesthesia Quick Evaluation

## 2023-02-15 NOTE — Anesthesia Postprocedure Evaluation (Signed)
Anesthesia Post Note  Patient: Dana Harrell  Procedure(s) Performed: HYSTERECTOMY VAGINAL WITH BILATERAL SALPINGECTOMY (Bilateral: Uterus)  Patient location during evaluation: PACU Anesthesia Type: General Level of consciousness: awake and alert Pain management: pain level controlled Vital Signs Assessment: post-procedure vital signs reviewed and stable Respiratory status: spontaneous breathing, nonlabored ventilation, respiratory function stable and patient connected to nasal cannula oxygen Cardiovascular status: blood pressure returned to baseline and stable Postop Assessment: no apparent nausea or vomiting Anesthetic complications: yes   Encounter Notable Events  Notable Event Outcome Phase Comment  Difficult to intubate - expected  Intraprocedure Filed from anesthesia note documentation.     Last Vitals:  Vitals:   02/15/23 1042 02/15/23 1120  BP: 129/64 106/67  Pulse: 86 84  Resp: 17 17  Temp: (!) 35.9 C (!) 36.1 C  SpO2: 97% 97%    Last Pain:  Vitals:   02/15/23 1120  TempSrc: Temporal  PainSc: 0-No pain                 Cleda Mccreedy Sherronda Sweigert

## 2023-02-15 NOTE — Brief Op Note (Signed)
02/15/2023  9:31 AM  PATIENT:  Dana Harrell  57 y.o. female  PRE-OPERATIVE DIAGNOSIS:  postmenopausal bleeding, endometrial polyp  POST-OPERATIVE DIAGNOSIS:  postmenopausal bleeding, endometrial polyp  PROCEDURE:  Procedure(s): HYSTERECTOMY VAGINAL WITH BILATERAL SALPINGECTOMY (Bilateral)  SURGEON:  Surgeon(s) and Role:    * Sierra Spargo, Ihor Austin, MD - Primary    * Christeen Douglas, MD - Assisting  PHYSICIAN ASSISTANT: CST  ASSISTANTS: none   ANESTHESIA:   general  EBL:  EBL 50 cc IOF 1000 cc OU200cc  BLOOD ADMINISTERED:none  DRAINS: none   LOCAL MEDICATIONS USED:  LIDOCAINE   SPECIMEN:  Source of Specimen:  cx , uterus , bilateral tubes  DISPOSITION OF SPECIMEN:  PATHOLOGY  COUNTS:  YES  TOURNIQUET:  * No tourniquets in log *  DICTATION: .Other Dictation: Dictation Number verbal  PLAN OF CARE: Discharge to home after PACU  PATIENT DISPOSITION:  PACU - hemodynamically stable.   Delay start of Pharmacological VTE agent (>24hrs) due to surgical blood loss or risk of bleeding: not applicable

## 2023-02-15 NOTE — Progress Notes (Signed)
Pt here for TVH and BSo . LAbs reviewed . NPO . All questions answered . Proceed

## 2023-02-16 ENCOUNTER — Encounter: Payer: Self-pay | Admitting: Obstetrics and Gynecology

## 2023-02-16 LAB — SURGICAL PATHOLOGY

## 2023-04-11 DIAGNOSIS — F331 Major depressive disorder, recurrent, moderate: Secondary | ICD-10-CM | POA: Diagnosis not present

## 2023-05-18 DIAGNOSIS — F331 Major depressive disorder, recurrent, moderate: Secondary | ICD-10-CM | POA: Diagnosis not present

## 2023-06-15 DIAGNOSIS — F331 Major depressive disorder, recurrent, moderate: Secondary | ICD-10-CM | POA: Diagnosis not present

## 2023-07-06 DIAGNOSIS — F331 Major depressive disorder, recurrent, moderate: Secondary | ICD-10-CM | POA: Diagnosis not present

## 2023-08-08 ENCOUNTER — Other Ambulatory Visit: Payer: Self-pay | Admitting: Gerontology

## 2023-08-08 DIAGNOSIS — Z1231 Encounter for screening mammogram for malignant neoplasm of breast: Secondary | ICD-10-CM | POA: Diagnosis not present

## 2023-08-08 DIAGNOSIS — Z09 Encounter for follow-up examination after completed treatment for conditions other than malignant neoplasm: Secondary | ICD-10-CM | POA: Diagnosis not present

## 2023-08-08 DIAGNOSIS — Z853 Personal history of malignant neoplasm of breast: Secondary | ICD-10-CM | POA: Diagnosis not present

## 2023-08-08 DIAGNOSIS — K219 Gastro-esophageal reflux disease without esophagitis: Secondary | ICD-10-CM | POA: Diagnosis not present

## 2023-08-08 DIAGNOSIS — E782 Mixed hyperlipidemia: Secondary | ICD-10-CM | POA: Diagnosis not present

## 2023-08-08 DIAGNOSIS — I10 Essential (primary) hypertension: Secondary | ICD-10-CM | POA: Diagnosis not present

## 2023-08-08 DIAGNOSIS — R748 Abnormal levels of other serum enzymes: Secondary | ICD-10-CM | POA: Diagnosis not present

## 2023-08-08 DIAGNOSIS — F32A Depression, unspecified: Secondary | ICD-10-CM | POA: Diagnosis not present

## 2023-08-08 DIAGNOSIS — F419 Anxiety disorder, unspecified: Secondary | ICD-10-CM | POA: Diagnosis not present

## 2023-08-08 DIAGNOSIS — E559 Vitamin D deficiency, unspecified: Secondary | ICD-10-CM | POA: Diagnosis not present

## 2023-09-17 DIAGNOSIS — M9904 Segmental and somatic dysfunction of sacral region: Secondary | ICD-10-CM | POA: Diagnosis not present

## 2023-09-17 DIAGNOSIS — M9903 Segmental and somatic dysfunction of lumbar region: Secondary | ICD-10-CM | POA: Diagnosis not present

## 2023-09-17 DIAGNOSIS — M9905 Segmental and somatic dysfunction of pelvic region: Secondary | ICD-10-CM | POA: Diagnosis not present

## 2023-09-17 DIAGNOSIS — M9902 Segmental and somatic dysfunction of thoracic region: Secondary | ICD-10-CM | POA: Diagnosis not present

## 2023-09-17 DIAGNOSIS — M5387 Other specified dorsopathies, lumbosacral region: Secondary | ICD-10-CM | POA: Diagnosis not present

## 2023-09-17 DIAGNOSIS — M4716 Other spondylosis with myelopathy, lumbar region: Secondary | ICD-10-CM | POA: Diagnosis not present

## 2023-09-17 DIAGNOSIS — M4120 Other idiopathic scoliosis, site unspecified: Secondary | ICD-10-CM | POA: Diagnosis not present

## 2023-09-17 DIAGNOSIS — M9901 Segmental and somatic dysfunction of cervical region: Secondary | ICD-10-CM | POA: Diagnosis not present

## 2023-09-18 DIAGNOSIS — M9904 Segmental and somatic dysfunction of sacral region: Secondary | ICD-10-CM | POA: Diagnosis not present

## 2023-09-18 DIAGNOSIS — M9902 Segmental and somatic dysfunction of thoracic region: Secondary | ICD-10-CM | POA: Diagnosis not present

## 2023-09-18 DIAGNOSIS — M4120 Other idiopathic scoliosis, site unspecified: Secondary | ICD-10-CM | POA: Diagnosis not present

## 2023-09-18 DIAGNOSIS — M9903 Segmental and somatic dysfunction of lumbar region: Secondary | ICD-10-CM | POA: Diagnosis not present

## 2023-09-18 DIAGNOSIS — M5387 Other specified dorsopathies, lumbosacral region: Secondary | ICD-10-CM | POA: Diagnosis not present

## 2023-09-18 DIAGNOSIS — M9901 Segmental and somatic dysfunction of cervical region: Secondary | ICD-10-CM | POA: Diagnosis not present

## 2023-09-18 DIAGNOSIS — M9905 Segmental and somatic dysfunction of pelvic region: Secondary | ICD-10-CM | POA: Diagnosis not present

## 2023-09-18 DIAGNOSIS — M4716 Other spondylosis with myelopathy, lumbar region: Secondary | ICD-10-CM | POA: Diagnosis not present

## 2023-09-19 ENCOUNTER — Ambulatory Visit
Admission: EM | Admit: 2023-09-19 | Discharge: 2023-09-19 | Disposition: A | Discharge status: Home / Self Care | Payer: 59 | Attending: Family Medicine | Admitting: Family Medicine

## 2023-09-19 DIAGNOSIS — M5417 Radiculopathy, lumbosacral region: Secondary | ICD-10-CM

## 2023-09-19 MED ORDER — GABAPENTIN 100 MG PO CAPS: 100.0000 mg | ORAL_CAPSULE | Freq: Three times a day (TID) | ORAL | 0 refills | Status: AC | PRN

## 2023-09-19 MED ORDER — DEXAMETHASONE SODIUM PHOSPHATE 10 MG/ML IJ SOLN
10.0000 mg | Freq: Once | INTRAMUSCULAR | Status: AC
  Administered 2023-09-19: 10 mg via INTRAMUSCULAR

## 2023-09-19 MED ORDER — CYCLOBENZAPRINE HCL 5 MG PO TABS: 5.0000 mg | ORAL_TABLET | Freq: Three times a day (TID) | ORAL | 0 refills | Status: AC | PRN

## 2023-09-19 MED ORDER — IBUPROFEN 800 MG PO TABS: 800.0000 mg | ORAL_TABLET | Freq: Three times a day (TID) | ORAL | 0 refills | Status: AC

## 2023-09-19 NOTE — ED Triage Notes (Signed)
Pt c/o back pain x6 days. Denies any falls,injuries or heavy lifting. States went to chiropractor on Monday which made it worse.

## 2023-09-19 NOTE — ED Provider Notes (Signed)
[facility]    CSN: [id number] Arrival date & time: 09/19/23  0808      History   Chief Complaint Chief Complaint  Patient presents with   Back Pain    HPI  HPI Dana Harrell is a 57 y.o. female.   Dana Harrell presents for low back pain that started 6 days ago.   She got up off the couch and went got into the bed and felt back pain.  Pain was better when she got up to walk. She went to the chiropractor who told her she had scoliosis and one of her hips is higher than the other.  Back pain is new for her.  She went back to the chiropractor who did an adjustment yesterday and her pain is worse.  This was her first time being adjusted by a chiropractor.  She has left hip and low back pain that radiates down her left leg.  Her husband told her to go see a doctor this morning therefore she came to the urgent care.   She took ibuprofen 800 mg and Gabapentin which has helped. No saddle parethesias, bowel and bladder incontinece.  She has been putting up Christmas decorations but doesn't think this is the cause of her pain.      Past Medical History:  Diagnosis Date   Anxiety    Breast cancer (HCC) 2018   invasive mammary and low grade DCIS   Family history of adverse reaction to anesthesia    sister PONV   GERD (gastroesophageal reflux disease)    History of kidney stones    Hypertension    Personal history of radiation therapy 2018   left breast cancer   Rhabdomyolysis 2009    Patient Active Problem List   Diagnosis Date Noted   Vitamin D deficiency 06/05/2019   Gastroesophageal reflux disease without esophagitis 06/04/2019   Moderate mitral insufficiency 09/28/2017   Hyperlipidemia, mixed 09/18/2017   IGT (impaired glucose tolerance) 08/27/2017   Breast cancer, left breast (HCC) 02/26/2017   Anxiety and depression 08/12/2014   Hypertension 08/12/2014    Past Surgical History:  Procedure Laterality Date   BREAST BIOPSY Left 2018   invasive mammary carcinoma  and low grade DCIS   BREAST LUMPECTOMY Left 2018   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   CHOLECYSTECTOMY     COLONOSCOPY     4/17   MASTECTOMY, PARTIAL Left 03/09/2017   Procedure: MASTECTOMY PARTIAL;  Surgeon: Nadeen Landau, MD;  Location: [facility];  Service: General;  Laterality: Left;   MS MAMMO SCREENING     4/18   OTHER SURGICAL HISTORY     pap 4/18   SENTINEL NODE BIOPSY Left 03/09/2017   Procedure: SENTINEL NODE BIOPSY;  Surgeon: Nadeen Landau, MD;  Location: [facility];  Service: General;  Laterality: Left;   VAGINAL HYSTERECTOMY Bilateral 02/15/2023   Procedure: HYSTERECTOMY VAGINAL WITH BILATERAL SALPINGECTOMY;  Surgeon: Suzy Bouchard, MD;  Location: [facility];  Service: Gynecology;  Laterality: Bilateral;    OB History   No obstetric history on file.      Home Medications    Prior to Admission medications   Medication Sig Start Date End Date Taking? Authorizing Provider  acetaminophen (TYLENOL) 500 MG tablet Take 1,000 mg by mouth every 6 (six) hours as needed.   Yes [provider]  amLODipine (NORVASC) 10 MG tablet Take 10 mg by mouth every morning.    Yes [provider]  cholecalciferol (VITAMIN D3) 25 MCG (1000 UNIT) tablet Take 1,000 Units by mouth daily.   Yes [provider]  cyclobenzaprine (FLEXERIL) 5 MG tablet Take 1 tablet (5 mg total) by mouth 3 (three) times daily as needed. 09/19/23  Yes Tamas Suen, Seward Meth, DO  ezetimibe (ZETIA) 10 MG tablet Take 10 mg by mouth daily.   Yes [provider]  gabapentin (NEURONTIN) 100 MG capsule Take 1 capsule (100 mg total) by mouth 3 (three) times daily as needed. 09/19/23  Yes Valbona Slabach, DO  ibuprofen (ADVIL) 800 MG tablet Take 1 tablet (800 mg total) by mouth 3 (three) times daily. 09/19/23  Yes Sheba Whaling, DO  RABEprazole (ACIPHEX) 20 MG tablet Take 20 mg by mouth daily.   Yes [provider]  sertraline (ZOLOFT) 50 MG tablet Take  50 mg by mouth every morning.    Yes [provider]    Family History Family History  Problem Relation Age of Onset   Colon cancer Mother 76   Breast cancer Maternal Aunt        3 materal aunts all 87's   Breast cancer Maternal Aunt    Breast cancer Maternal Aunt     Social History Social History   Tobacco Use   Smoking status: Never   Smokeless tobacco: Never  Vaping Use   Vaping status: Never Used  Substance Use Topics   Alcohol use: Yes    Comment: socially   Drug use: No     Allergies   Lovastatin, Motrin [ibuprofen], and Promethazine   Review of Systems Review of Systems: egative unless otherwise stated in HPI.      Physical Exam Triage Vital Signs ED Triage Vitals [09/19/23 0821]  Encounter Vitals Group     BP      Systolic BP Percentile      Diastolic BP Percentile      Pulse      Resp 16     Temp      Temp Source Oral     SpO2      Weight      Height      Head Circumference      Peak Flow      Pain Score      Pain Loc      Pain Education      Exclude from Growth Chart    No data found.  Updated Vital Signs BP (!) 140/85 (BP Location: Right Arm)   Pulse 79   Temp 98.4 F (36.9 C) (Oral)   Resp 16   Ht 5\' 6"  (1.676 m)   Wt 68 kg   LMP 12/07/2016   SpO2 96%   BMI 24.21 kg/m   Visual Acuity Right Eye Distance:   Left Eye Distance:   Bilateral Distance:    Right Eye Near:   Left Eye Near:    Bilateral Near:     Physical Exam GEN: well appearing female in no acute distress  CVS: well perfused  RESP: speaking in full sentences without pause, no respiratory distress  MSK:  Lumbar spine: - Inspection: no gross deformity or asymmetry, swelling or ecchymosis. No skin changes  - Palpation: No TTP over the spinous processes, left lumbar paraspinal muscles with hypertonicity, left SI joint tenderness but no right SI joint tenderness - ROM: limited ROM of the lumbar spine in flexion and extension due to pain - Strength: 5/5  strength of lower extremity in L4-S1 nerve root distributions b/l - Neuro: sensation  intact in the L4-S1 nerve root distribution b/l, 2+ L4 and S1 reflexes - Special testing: positive straight leg raise on right SKIN: warm, dry, no overly skin rash or erythema    UC Treatments / Results  Labs (all labs ordered are listed, but only abnormal results are displayed) Labs Reviewed - No data to display  EKG   Radiology No results found.   Procedures Procedures (including critical care time)  Medications Ordered in UC Medications  dexamethasone (DECADRON) injection 10 mg (10 mg Intramuscular Given 09/19/23 0856)    Initial Impression / Assessment and Plan / UC Course  I have reviewed the triage vital signs and the nursing notes.  Pertinent labs & imaging results that were available during my care of the patient were reviewed by me and considered in my medical decision making (see chart for details).      Pt is a 57 y.o.  female with 6 days of lower back pain without known injury, fall or trauma.  Has no history of low back pain.   On chart review, she has not lumbar imaging available to review but she states she had a x-ray performed at the chiropractor's office within the last week.  Imaging deferred here.  On exam, she has left-sided SI joint tenderness which also could be nerve irritation versus arthritis.  Recommended steroids p.o. versus IM and she chose to IM steroids.  Decadron 10 mg given IM.   Patient to gradually return to normal activities, as tolerated and continue ordinary activities within the limits permitted by pain. Prescribed gabapentin, ibuprofen and muscle relaxer  for pain relief. Tylenol and Lidocaine patches PRN for multimodal pain relief. Counseled patient on red flag symptoms and when to seek immediate care.  No red flags suggesting cauda equina syndrome or progressive major motor weakness. Patient to follow up with orthopedic provider, if symptoms do not  improve with conservative treatment.  Return and ED precautions given.    Discussed MDM, treatment plan and plan for follow-up with patient who agrees with plan.   Final Clinical Impressions(s) / UC Diagnoses   Final diagnoses:  Lumbosacral radiculopathy     Discharge Instructions      If medication was prescribed, stop by the pharmacy to pick up your prescriptions.  For your  pain, Take 1000 mg Tylenol, muscle relaxer (Flexeril) and ibuprofen 3 times a day,  as needed for pain.  Apply cold compresses intermittently, as needed.  As pain recedes, begin normal activities slowly as tolerated.  Follow up with primary care provider or an orthopedic provider, if symptoms persist.  Watch for worsening symptoms such as an increasing weakness or loss of sensation, increasing pain and/or the loss of bladder or bowel function. Should any of these occur, go to the emergency department immediately.        ED Prescriptions     Medication Sig Dispense Auth. Provider   ibuprofen (ADVIL) 800 MG tablet Take 1 tablet (800 mg total) by mouth 3 (three) times daily. 21 tablet Navon Kotowski, DO   gabapentin (NEURONTIN) 100 MG capsule Take 1 capsule (100 mg total) by mouth 3 (three) times daily as needed. 30 capsule Surabhi Gadea, DO   cyclobenzaprine (FLEXERIL) 5 MG tablet Take 1 tablet (5 mg total) by mouth 3 (three) times daily as needed. 30 tablet Katha Cabal, DO      PDMP not reviewed this encounter.   Katha Cabal, DO 09/19/23 1120

## 2023-09-19 NOTE — Discharge Instructions (Addendum)
If medication was prescribed, stop by the pharmacy to pick up your prescriptions.  For your  pain, Take 1000 mg Tylenol, muscle relaxer (Flexeril) and ibuprofen 3 times a day,  as needed for pain.  Apply cold compresses intermittently, as needed.  As pain recedes, begin normal activities slowly as tolerated.  Follow up with primary care provider or an orthopedic provider, if symptoms persist.  Watch for worsening symptoms such as an increasing weakness or loss of sensation, increasing pain and/or the loss of bladder or bowel function. Should any of these occur, go to the emergency department immediately.

## 2023-09-26 DIAGNOSIS — M461 Sacroiliitis, not elsewhere classified: Secondary | ICD-10-CM | POA: Diagnosis not present

## 2023-10-24 DIAGNOSIS — M5416 Radiculopathy, lumbar region: Secondary | ICD-10-CM | POA: Diagnosis not present

## 2023-10-24 DIAGNOSIS — M461 Sacroiliitis, not elsewhere classified: Secondary | ICD-10-CM | POA: Diagnosis not present

## 2023-10-24 DIAGNOSIS — G5712 Meralgia paresthetica, left lower limb: Secondary | ICD-10-CM | POA: Diagnosis not present

## 2023-11-06 ENCOUNTER — Ambulatory Visit
Admission: RE | Admit: 2023-11-06 | Discharge: 2023-11-06 | Disposition: A | Discharge status: Home / Self Care | Payer: 59 | Source: Ambulatory Visit | Attending: Gerontology | Admitting: Gerontology

## 2023-11-06 DIAGNOSIS — Z1231 Encounter for screening mammogram for malignant neoplasm of breast: Secondary | ICD-10-CM | POA: Diagnosis not present

## 2023-11-09 DIAGNOSIS — R202 Paresthesia of skin: Secondary | ICD-10-CM | POA: Diagnosis not present

## 2024-02-06 DIAGNOSIS — R748 Abnormal levels of other serum enzymes: Secondary | ICD-10-CM | POA: Diagnosis not present

## 2024-02-06 DIAGNOSIS — R7303 Prediabetes: Secondary | ICD-10-CM | POA: Diagnosis not present

## 2024-02-06 DIAGNOSIS — N841 Polyp of cervix uteri: Secondary | ICD-10-CM | POA: Diagnosis not present

## 2024-02-06 DIAGNOSIS — I10 Essential (primary) hypertension: Secondary | ICD-10-CM | POA: Diagnosis not present

## 2024-02-06 DIAGNOSIS — K219 Gastro-esophageal reflux disease without esophagitis: Secondary | ICD-10-CM | POA: Diagnosis not present

## 2024-02-06 DIAGNOSIS — I340 Nonrheumatic mitral (valve) insufficiency: Secondary | ICD-10-CM | POA: Diagnosis not present

## 2024-02-06 DIAGNOSIS — F419 Anxiety disorder, unspecified: Secondary | ICD-10-CM | POA: Diagnosis not present

## 2024-02-06 DIAGNOSIS — Z0000 Encounter for general adult medical examination without abnormal findings: Secondary | ICD-10-CM | POA: Diagnosis not present

## 2024-02-06 DIAGNOSIS — E559 Vitamin D deficiency, unspecified: Secondary | ICD-10-CM | POA: Diagnosis not present

## 2024-02-06 DIAGNOSIS — K5730 Diverticulosis of large intestine without perforation or abscess without bleeding: Secondary | ICD-10-CM | POA: Diagnosis not present

## 2024-02-06 DIAGNOSIS — E782 Mixed hyperlipidemia: Secondary | ICD-10-CM | POA: Diagnosis not present

## 2024-02-06 DIAGNOSIS — Z853 Personal history of malignant neoplasm of breast: Secondary | ICD-10-CM | POA: Diagnosis not present

## 2024-05-08 ENCOUNTER — Encounter: Payer: Self-pay | Admitting: Anesthesiology

## 2024-05-19 ENCOUNTER — Ambulatory Visit: Admission: RE | Admit: 2024-05-19 | Source: Home / Self Care | Admitting: Gastroenterology

## 2024-05-19 ENCOUNTER — Encounter: Admission: RE | Payer: Self-pay | Source: Home / Self Care

## 2024-05-19 SURGERY — COLONOSCOPY
Anesthesia: General

## 2024-08-12 DIAGNOSIS — K219 Gastro-esophageal reflux disease without esophagitis: Secondary | ICD-10-CM | POA: Diagnosis not present

## 2024-08-12 DIAGNOSIS — Z09 Encounter for follow-up examination after completed treatment for conditions other than malignant neoplasm: Secondary | ICD-10-CM | POA: Diagnosis not present

## 2024-08-12 DIAGNOSIS — E559 Vitamin D deficiency, unspecified: Secondary | ICD-10-CM | POA: Diagnosis not present

## 2024-08-12 DIAGNOSIS — F32A Depression, unspecified: Secondary | ICD-10-CM | POA: Diagnosis not present

## 2024-08-12 DIAGNOSIS — I10 Essential (primary) hypertension: Secondary | ICD-10-CM | POA: Diagnosis not present

## 2024-08-12 DIAGNOSIS — F419 Anxiety disorder, unspecified: Secondary | ICD-10-CM | POA: Diagnosis not present

## 2024-08-12 DIAGNOSIS — K5730 Diverticulosis of large intestine without perforation or abscess without bleeding: Secondary | ICD-10-CM | POA: Diagnosis not present

## 2024-08-12 DIAGNOSIS — E782 Mixed hyperlipidemia: Secondary | ICD-10-CM | POA: Diagnosis not present

## 2024-08-12 DIAGNOSIS — Z1331 Encounter for screening for depression: Secondary | ICD-10-CM | POA: Diagnosis not present
